# Patient Record
Sex: Female | Born: 1985 | Race: White | Hispanic: No | Marital: Single | Smoking: Current every day smoker
Health system: Southern US, Community
[De-identification: ages and names within clinical notes are randomized; demographics above are authoritative.]

## PROBLEM LIST (undated history)

## (undated) DIAGNOSIS — Z87442 Personal history of urinary calculi: Secondary | ICD-10-CM

## (undated) DIAGNOSIS — G44009 Cluster headache syndrome, unspecified, not intractable: Secondary | ICD-10-CM

## (undated) DIAGNOSIS — N200 Calculus of kidney: Secondary | ICD-10-CM

## (undated) HISTORY — PX: TUBAL LIGATION: SHX77

---

## 2016-10-02 HISTORY — PX: CYSTOSCOPY/URETEROSCOPY/HOLMIUM LASER/STENT PLACEMENT: SHX6546

## 2017-05-01 ENCOUNTER — Inpatient Hospital Stay: Payer: Medicaid Other | Admitting: Anesthesiology

## 2017-05-01 ENCOUNTER — Emergency Department: Payer: Medicaid Other

## 2017-05-01 ENCOUNTER — Encounter: Payer: Self-pay | Admitting: Emergency Medicine

## 2017-05-01 ENCOUNTER — Observation Stay
Admission: EM | Admit: 2017-05-01 | Discharge: 2017-05-02 | Disposition: A | Discharge status: Home / Self Care | Payer: Medicaid Other | Attending: Internal Medicine | Admitting: Internal Medicine

## 2017-05-01 ENCOUNTER — Encounter
Admission: EM | Disposition: A | Discharge status: Home / Self Care | Payer: Self-pay | Source: Home / Self Care | Attending: Emergency Medicine

## 2017-05-01 DIAGNOSIS — N132 Hydronephrosis with renal and ureteral calculous obstruction: Principal | ICD-10-CM | POA: Insufficient documentation

## 2017-05-01 DIAGNOSIS — F17200 Nicotine dependence, unspecified, uncomplicated: Secondary | ICD-10-CM | POA: Insufficient documentation

## 2017-05-01 DIAGNOSIS — N390 Urinary tract infection, site not specified: Secondary | ICD-10-CM | POA: Insufficient documentation

## 2017-05-01 DIAGNOSIS — L03115 Cellulitis of right lower limb: Secondary | ICD-10-CM | POA: Insufficient documentation

## 2017-05-01 DIAGNOSIS — N1330 Unspecified hydronephrosis: Secondary | ICD-10-CM

## 2017-05-01 DIAGNOSIS — Z880 Allergy status to penicillin: Secondary | ICD-10-CM | POA: Insufficient documentation

## 2017-05-01 DIAGNOSIS — N131 Hydronephrosis with ureteral stricture, not elsewhere classified: Secondary | ICD-10-CM

## 2017-05-01 DIAGNOSIS — R000 Tachycardia, unspecified: Secondary | ICD-10-CM | POA: Insufficient documentation

## 2017-05-01 DIAGNOSIS — W57XXXA Bitten or stung by nonvenomous insect and other nonvenomous arthropods, initial encounter: Secondary | ICD-10-CM | POA: Insufficient documentation

## 2017-05-01 DIAGNOSIS — Z87442 Personal history of urinary calculi: Secondary | ICD-10-CM | POA: Insufficient documentation

## 2017-05-01 DIAGNOSIS — R1032 Left lower quadrant pain: Secondary | ICD-10-CM

## 2017-05-01 DIAGNOSIS — N200 Calculus of kidney: Secondary | ICD-10-CM

## 2017-05-01 DIAGNOSIS — Z9851 Tubal ligation status: Secondary | ICD-10-CM | POA: Insufficient documentation

## 2017-05-01 HISTORY — PX: CYSTOSCOPY W/ RETROGRADES: SHX1426

## 2017-05-01 HISTORY — PX: CYSTOSCOPY WITH STENT PLACEMENT: SHX5790

## 2017-05-01 HISTORY — DX: Cluster headache syndrome, unspecified, not intractable: G44.009

## 2017-05-01 HISTORY — DX: Calculus of kidney: N20.0

## 2017-05-01 LAB — CBC WITH DIFFERENTIAL/PLATELET
Basophils Absolute: 0.1 10*3/uL (ref 0–0.1)
Basophils Relative: 1 %
Eosinophils Absolute: 0.1 10*3/uL (ref 0–0.7)
Eosinophils Relative: 1 %
HCT: 40.9 % (ref 35.0–47.0)
Hemoglobin: 14.4 g/dL (ref 12.0–16.0)
Lymphocytes Relative: 27 %
Lymphs Abs: 2.6 10*3/uL (ref 1.0–3.6)
MCH: 31.2 pg (ref 26.0–34.0)
MCHC: 35.1 g/dL (ref 32.0–36.0)
MCV: 88.8 fL (ref 80.0–100.0)
Monocytes Absolute: 0.7 10*3/uL (ref 0.2–0.9)
Monocytes Relative: 7 %
Neutro Abs: 6.5 10*3/uL (ref 1.4–6.5)
Neutrophils Relative %: 66 %
Platelets: 230 10*3/uL (ref 150–440)
RBC: 4.61 MIL/uL (ref 3.80–5.20)
RDW: 12.6 % (ref 11.5–14.5)
WBC: 9.9 10*3/uL (ref 3.6–11.0)

## 2017-05-01 LAB — COMPREHENSIVE METABOLIC PANEL
ALT: 20 U/L (ref 14–54)
AST: 21 U/L (ref 15–41)
Albumin: 4.1 g/dL (ref 3.5–5.0)
Alkaline Phosphatase: 72 U/L (ref 38–126)
Anion gap: 7 (ref 5–15)
BUN: 9 mg/dL (ref 6–20)
CO2: 26 mmol/L (ref 22–32)
Calcium: 9 mg/dL (ref 8.9–10.3)
Chloride: 101 mmol/L (ref 101–111)
Creatinine, Ser: 0.72 mg/dL (ref 0.44–1.00)
GFR calc Af Amer: >60 mL/min (ref >60)
GFR calc non Af Amer: >60 mL/min (ref >60)
Glucose, Bld: 98 mg/dL (ref 65–99)
Potassium: 3.5 mmol/L (ref 3.5–5.1)
Sodium: 134 mmol/L — ABNORMAL LOW (ref 135–145)
Total Bilirubin: 0.8 mg/dL (ref 0.3–1.2)
Total Protein: 7.3 g/dL (ref 6.5–8.1)

## 2017-05-01 LAB — BASIC METABOLIC PANEL
ANION GAP: 7 (ref 5–15)
BUN: 9 mg/dL (ref 6–20)
CALCIUM: 8.5 mg/dL — AB (ref 8.9–10.3)
CHLORIDE: 105 mmol/L (ref 101–111)
CO2: 25 mmol/L (ref 22–32)
Creatinine, Ser: 0.75 mg/dL (ref 0.44–1.00)
GFR calc non Af Amer: >60 mL/min (ref >60)
GLUCOSE: 111 mg/dL — AB (ref 65–99)
Potassium: 3.6 mmol/L (ref 3.5–5.1)
Sodium: 137 mmol/L (ref 135–145)

## 2017-05-01 LAB — URINALYSIS, ROUTINE W REFLEX MICROSCOPIC
Bilirubin Urine: NEGATIVE
GLUCOSE, UA: NEGATIVE mg/dL
Ketones, ur: NEGATIVE mg/dL
NITRITE: POSITIVE — AB
Protein, ur: NEGATIVE mg/dL
SPECIFIC GRAVITY, URINE: 1.008 (ref 1.005–1.030)
pH: 6 (ref 5.0–8.0)

## 2017-05-01 LAB — CBC
HCT: 38 % (ref 35.0–47.0)
HEMOGLOBIN: 13 g/dL (ref 12.0–16.0)
MCH: 30.9 pg (ref 26.0–34.0)
MCHC: 34.3 g/dL (ref 32.0–36.0)
MCV: 90.2 fL (ref 80.0–100.0)
Platelets: 203 10*3/uL (ref 150–440)
RBC: 4.21 MIL/uL (ref 3.80–5.20)
RDW: 12.6 % (ref 11.5–14.5)
WBC: 12.1 10*3/uL — ABNORMAL HIGH (ref 3.6–11.0)

## 2017-05-01 LAB — POCT PREGNANCY, URINE: Preg Test, Ur: NEGATIVE

## 2017-05-01 LAB — LACTIC ACID, PLASMA
Lactic Acid, Venous: 1 mmol/L (ref 0.5–1.9)
Lactic Acid, Venous: 1.1 mmol/L (ref 0.5–1.9)

## 2017-05-01 SURGERY — CYSTOSCOPY, WITH STENT INSERTION
Anesthesia: General | Site: Ureter | Laterality: Left | Wound class: Clean Contaminated

## 2017-05-01 MED ORDER — CEFTRIAXONE SODIUM-DEXTROSE 1-3.74 GM-% IV SOLR
1.0000 g | INTRAVENOUS | Status: DC
  Filled 2017-05-01 (×4): qty 50

## 2017-05-01 MED ORDER — IPRATROPIUM-ALBUTEROL 0.5-2.5 (3) MG/3ML IN SOLN
3.0000 mL | Freq: Once | RESPIRATORY_TRACT | Status: AC | PRN
  Administered 2017-05-01: 3 mL via RESPIRATORY_TRACT

## 2017-05-01 MED ORDER — MIDAZOLAM HCL 2 MG/2ML IJ SOLN
INTRAMUSCULAR | Status: DC | PRN
  Administered 2017-05-01: 2 mg via INTRAVENOUS

## 2017-05-01 MED ORDER — IPRATROPIUM-ALBUTEROL 0.5-2.5 (3) MG/3ML IN SOLN
RESPIRATORY_TRACT | Status: AC
  Filled 2017-05-01: qty 3

## 2017-05-01 MED ORDER — OXYCODONE HCL 5 MG/5ML PO SOLN: 5.0000 mg | Freq: Once | ORAL | Status: DC | PRN

## 2017-05-01 MED ORDER — ONDANSETRON HCL 4 MG/2ML IJ SOLN: 4.0000 mg | Freq: Four times a day (QID) | INTRAMUSCULAR | Status: DC | PRN

## 2017-05-01 MED ORDER — LACTATED RINGERS IV SOLN
INTRAVENOUS | Status: DC | PRN
  Administered 2017-05-01: 21:00:00 via INTRAVENOUS

## 2017-05-01 MED ORDER — PROPOFOL 10 MG/ML IV BOLUS
INTRAVENOUS | Status: DC | PRN
  Administered 2017-05-01: 180 mg via INTRAVENOUS
  Administered 2017-05-01: 100 mg via INTRAVENOUS

## 2017-05-01 MED ORDER — ONDANSETRON HCL 4 MG PO TABS: 4.0000 mg | ORAL_TABLET | Freq: Four times a day (QID) | ORAL | Status: DC | PRN

## 2017-05-01 MED ORDER — FENTANYL CITRATE (PF) 100 MCG/2ML IJ SOLN
INTRAMUSCULAR | Status: AC
  Filled 2017-05-01: qty 2

## 2017-05-01 MED ORDER — ONDANSETRON HCL 4 MG/2ML IJ SOLN
INTRAMUSCULAR | Status: DC | PRN
  Administered 2017-05-01: 4 mg via INTRAVENOUS

## 2017-05-01 MED ORDER — FENTANYL CITRATE (PF) 100 MCG/2ML IJ SOLN
INTRAMUSCULAR | Status: DC | PRN
  Administered 2017-05-01 (×2): 50 ug via INTRAVENOUS

## 2017-05-01 MED ORDER — ACETAMINOPHEN 325 MG PO TABS: 650.0000 mg | ORAL_TABLET | Freq: Four times a day (QID) | ORAL | Status: DC | PRN

## 2017-05-01 MED ORDER — PROPOFOL 10 MG/ML IV BOLUS
INTRAVENOUS | Status: AC
  Filled 2017-05-01: qty 20

## 2017-05-01 MED ORDER — LIDOCAINE HCL (CARDIAC) 20 MG/ML IV SOLN
INTRAVENOUS | Status: DC | PRN
  Administered 2017-05-01: 100 mg via INTRAVENOUS

## 2017-05-01 MED ORDER — OXYCODONE HCL 5 MG PO TABS: 5.0000 mg | ORAL_TABLET | Freq: Once | ORAL | Status: DC | PRN

## 2017-05-01 MED ORDER — CIPROFLOXACIN IN D5W 400 MG/200ML IV SOLN
400.0000 mg | INTRAVENOUS | Status: AC
  Administered 2017-05-01: 400 mg via INTRAVENOUS

## 2017-05-01 MED ORDER — HYDROCODONE-ACETAMINOPHEN 5-325 MG PO TABS: 1.0000 | ORAL_TABLET | ORAL | Status: DC | PRN

## 2017-05-01 MED ORDER — SUGAMMADEX SODIUM 200 MG/2ML IV SOLN
INTRAVENOUS | Status: DC | PRN
  Administered 2017-05-01: 180 mg via INTRAVENOUS

## 2017-05-01 MED ORDER — DEXAMETHASONE SODIUM PHOSPHATE 10 MG/ML IJ SOLN
INTRAMUSCULAR | Status: DC | PRN
  Administered 2017-05-01: 10 mg via INTRAVENOUS

## 2017-05-01 MED ORDER — MIDAZOLAM HCL 2 MG/2ML IJ SOLN
INTRAMUSCULAR | Status: AC
  Filled 2017-05-01: qty 2

## 2017-05-01 MED ORDER — POTASSIUM CHLORIDE IN NACL 20-0.9 MEQ/L-% IV SOLN
INTRAVENOUS | Status: DC
  Administered 2017-05-01: via INTRAVENOUS
  Filled 2017-05-01 (×5): qty 1000

## 2017-05-01 MED ORDER — KETOROLAC TROMETHAMINE 30 MG/ML IJ SOLN: 30.0000 mg | Freq: Four times a day (QID) | INTRAMUSCULAR | Status: DC | PRN

## 2017-05-01 MED ORDER — SODIUM CHLORIDE 0.9 % IV BOLUS (SEPSIS)
1000.0000 mL | Freq: Once | INTRAVENOUS | Status: AC
  Administered 2017-05-01: 1000 mL via INTRAVENOUS

## 2017-05-01 MED ORDER — IBUPROFEN 400 MG PO TABS
400.0000 mg | ORAL_TABLET | Freq: Four times a day (QID) | ORAL | Status: DC | PRN
  Administered 2017-05-01 – 2017-05-02 (×2): 400 mg via ORAL
  Filled 2017-05-01 (×2): qty 1

## 2017-05-01 MED ORDER — POLYETHYLENE GLYCOL 3350 17 G PO PACK: 17.0000 g | PACK | Freq: Every day | ORAL | Status: DC | PRN

## 2017-05-01 MED ORDER — ACETAMINOPHEN 650 MG RE SUPP: 650.0000 mg | Freq: Four times a day (QID) | RECTAL | Status: DC | PRN

## 2017-05-01 MED ORDER — CIPROFLOXACIN IN D5W 400 MG/200ML IV SOLN
INTRAVENOUS | Status: AC
  Administered 2017-05-01: 400 mg via INTRAVENOUS
  Filled 2017-05-01: qty 200

## 2017-05-01 MED ORDER — ROCURONIUM BROMIDE 100 MG/10ML IV SOLN
INTRAVENOUS | Status: DC | PRN
  Administered 2017-05-01: 5 mg via INTRAVENOUS
  Administered 2017-05-01: 10 mg via INTRAVENOUS

## 2017-05-01 MED ORDER — SODIUM CHLORIDE 0.9 % IV SOLN
INTRAVENOUS | Status: DC | PRN
  Administered 2017-05-01: 5 mL

## 2017-05-01 MED ORDER — FENTANYL CITRATE (PF) 100 MCG/2ML IJ SOLN
25.0000 ug | INTRAMUSCULAR | Status: DC | PRN
  Administered 2017-05-01 (×2): 25 ug via INTRAVENOUS

## 2017-05-01 MED ORDER — CEFTRIAXONE SODIUM IN DEXTROSE 20 MG/ML IV SOLN
1.0000 g | INTRAVENOUS | Status: DC
  Administered 2017-05-01: 1 g via INTRAVENOUS

## 2017-05-01 SURGICAL SUPPLY — 19 items
BAG DRAIN CYSTO-URO LG1000N (MISCELLANEOUS) ×2 IMPLANT
BOSTON SCIENTIFIC PERCUFLEX PLUS ×2 IMPLANT
CANISTER SUCT LVC 12 LTR MEDI- (MISCELLANEOUS) ×2 IMPLANT
CATH URETL 5X70 OPEN END (CATHETERS) ×2 IMPLANT
DRAPE XRAY CASSETTE 23X24 (DRAPES) ×2 IMPLANT
GOWN STRL REUS W/ TWL LRG LVL4 (GOWN DISPOSABLE) ×1 IMPLANT
GOWN STRL REUS W/ TWL XL LVL3 (GOWN DISPOSABLE) ×1 IMPLANT
GOWN STRL REUS W/TWL LRG LVL4 (GOWN DISPOSABLE) ×1
GOWN STRL REUS W/TWL XL LVL3 (GOWN DISPOSABLE) ×1
KIT RM TURNOVER CYSTO AR (KITS) ×2 IMPLANT
NS IRRIG 500ML POUR BTL (IV SOLUTION) ×2 IMPLANT
PACK CYSTO AR (MISCELLANEOUS) ×2 IMPLANT
SENSORWIRE 0.038 NOT ANGLED (WIRE) ×4
SET CYSTO W/LG BORE CLAMP LF (SET/KITS/TRAYS/PACK) ×2 IMPLANT
SOL .9 NS 3000ML IRR  AL (IV SOLUTION) ×1
SOL .9 NS 3000ML IRR UROMATIC (IV SOLUTION) ×1 IMPLANT
STENT URETL SOFT 6X24 (Stent) ×2 IMPLANT
WATER STERILE IRR 1000ML POUR (IV SOLUTION) ×2 IMPLANT
WIRE SENSOR 0.038 NOT ANGLED (WIRE) ×2 IMPLANT

## 2017-05-01 NOTE — Brief Op Note (Signed)
05/01/2017  9:24 PM  PATIENT:  Leeroy Bockheresa Gayle Trueba  31 y.o. female  PRE-OPERATIVE DIAGNOSIS:  hydronephritis,UTI and pain  POST-OPERATIVE DIAGNOSIS:  hydronephritis,UTI and pain  PROCEDURE:  Procedure(s): CYSTOSCOPY WITH STENT PLACEMENT (Left) CYSTOSCOPY WITH RETROGRADE PYELOGRAM (Left)  SURGEON:  Surgeon(s) and Role:    Bjorn Pippin* Sohil Timko, MD - Primary  PHYSICIAN ASSISTANT:   ASSISTANTS: none   ANESTHESIA:   general  EBL:  No intake/output data recorded.  BLOOD ADMINISTERED:none  DRAINS: 6 x 24 left JJ stent   LOCAL MEDICATIONS USED:  NONE  SPECIMEN:  No Specimen  DISPOSITION OF SPECIMEN:  N/A  COUNTS:  YES  TOURNIQUET:  * No tourniquets in log *  DICTATION: .Other Dictation: Dictation Number [id number]  PLAN OF CARE: Admit for overnight observation  PATIENT DISPOSITION:  PACU - hemodynamically stable.   Delay start of Pharmacological VTE agent (>24hrs) due to surgical blood loss or risk of bleeding: not applicable

## 2017-05-01 NOTE — ED Provider Notes (Signed)
[facility] Emergency Department Provider Note  ____________________________________________  Time seen: Approximately 3:47 PM  I have reviewed the triage vital signs and the nursing notes.   HISTORY  Chief Complaint Urinary Tract Infection and Insect Bite    HPI Kathleen Hutchinson is a 31 y.o. female with a history of nephrolithiasis presents to the emergency department with left flank pain, nocturia and general increased urinary frequency for the past 3 days. No dysuria or hematuria. She has recently moved to [place] from [place]. Patient states that she had to have stents placed for hydroureteronephrosis in October of 2017. Stents were removed after two weeks. Patient has been afebrile. No chills. Patient denies associated nausea, vomiting or abdominal pain. Patient also presents to the emergency department with a 3 cm region of circumferential cellulitis of the right thigh. Patient states "I think I was bitten by a spider". Patient denies muscle spasms, chest pain, weakness and radiculopathy. No alleviating measures have been attempted.    Past Medical History:  Diagnosis Date  . Headaches, cluster     There are no active problems to display for this patient.   Past Surgical History:  Procedure Laterality Date  . TUBAL LIGATION      Prior to Admission medications   Medication Sig Start Date End Date Taking? Authorizing Provider  ibuprofen (ADVIL,MOTRIN) 200 MG tablet Take 200 mg by mouth every 6 (six) hours as needed.   Yes [provider]  Multiple Vitamin (MULTIVITAMIN) tablet Take 1 tablet by mouth daily.   Yes [provider]    Allergies Augmentin [amoxicillin-pot clavulanate]  No family history on file.  Social History Social History  Substance Use Topics  . Smoking status: Current Every Day Smoker  . Smokeless tobacco: Never Used  . Alcohol use No     Review of Systems  Constitutional: No  fever/chills Eyes: No visual changes. No discharge ENT: No upper respiratory complaints. Cardiovascular: no chest pain. Respiratory: no cough. No SOB. Gastrointestinal: No abdominal pain.  No nausea, no vomiting.  No diarrhea.  No constipation. Genitourinary: Patient has nocturia and increased urinary frequency along with left flank pain. Musculoskeletal: Negative for musculoskeletal pain. Skin: Negative for rash, abrasions, lacerations, ecchymosis. Neurological: Negative for headaches, focal weakness or numbness.  ____________________________________________   PHYSICAL EXAM:  VITAL SIGNS: ED Triage Vitals  Enc Vitals Group     BP 05/01/17 1523 (!) 148/77     Pulse Rate 05/01/17 1523 (!) 104     Resp 05/01/17 1523 20     Temp 05/01/17 1523 98 F (36.7 C)     Temp Source 05/01/17 1523 Oral     SpO2 05/01/17 1523 100 %     Weight 05/01/17 1524 200 lb (90.7 kg)     Height 05/01/17 1524 5\' 8"  (1.727 m)     Head Circumference --      Peak Flow --      Pain Score 05/01/17 1523 5     Pain Loc --      Pain Edu? --      Excl. in GC? --      Constitutional: Alert and oriented. Well appearing and in no acute distress. Eyes: Conjunctivae are normal. PERRL. EOMI. Head: Atraumatic. Cardiovascular: Normal rate, regular rhythm. Normal S1 and S2.  Good peripheral circulation. Respiratory: Normal respiratory effort without tachypnea or retractions. Lungs CTAB. Good air entry to the bases with no decreased or absent breath sounds. Gastrointestinal: Bowel sounds 4 quadrants. Soft and  nontender to palpation. No guarding or rigidity. No palpable masses. No distention. Patient has left-sided CVA tenderness. Musculoskeletal: Full range of motion to all extremities. No gross deformities appreciated. Neurologic:  Normal speech and language. No gross focal neurologic deficits are appreciated.  Skin: Patient has 3 cm of circumferential cellulitis of the right thigh. Psychiatric: Mood and affect  are normal. Speech and behavior are normal. Patient exhibits appropriate insight and judgement.   ____________________________________________   LABS (all labs ordered are listed, but only abnormal results are displayed)  Labs Reviewed  URINALYSIS, ROUTINE W REFLEX MICROSCOPIC - Abnormal; Notable for the following:       Result Value   Color, Urine YELLOW (*)    APPearance CLOUDY (*)    Hgb urine dipstick SMALL (*)    Nitrite POSITIVE (*)    Leukocytes, UA LARGE (*)    Bacteria, UA MANY (*)    Squamous Epithelial / LPF 6-30 (*)    All other components within normal limits  CBC WITH DIFFERENTIAL/PLATELET  COMPREHENSIVE METABOLIC PANEL  LACTIC ACID, PLASMA  LACTIC ACID, PLASMA  POC URINE PREG, ED  POCT PREGNANCY, URINE   ____________________________________________  EKG   ____________________________________________  RADIOLOGY Geraldo PitterI, Fawne Hughley M Alexandar Weisenberger, personally viewed and evaluated these images  as part of my medical decision making, as well as reviewing the written report by the radiologist.    Ct Renal Stone Study  Result Date: 05/01/2017 CLINICAL DATA:  Urinary frequency and low back pain. EXAM: CT ABDOMEN AND PELVIS WITHOUT CONTRAST TECHNIQUE: Multidetector CT imaging of the abdomen and pelvis was performed following the standard protocol without IV contrast. COMPARISON:  None. FINDINGS: Lower chest: No pulmonary nodules or pleural effusion. No visible pericardial effusion. Hepatobiliary: Normal noncontrast appearance of the liver. No visible biliary dilatation. Normal gallbladder. Pancreas: Normal noncontrast appearance of the pancreas. No peripancreatic fluid collection. Spleen: Normal. Adrenals/Urinary Tract: --Adrenal glands: Normal. --Right kidney/ureter: No hydronephrosis or perinephric stranding. No nephrolithiasis. No obstructing ureteral stones. --Left kidney/ureter: There is severe left hydroureteronephrosis. There is no obstructing stone within the ureter or at the  ureterovesical junction. There are multiple nonobstructing left renal stones. A stone at the upper pole measures 8 mm. Stone the lower pole measures 5 mm. A third stone measures 5 mm. There is no perinephric stranding. --Urinary bladder: Partially decompressed. No focal abnormality visualized. Stomach/Bowel: There is no hiatal hernia. The stomach and duodenum are normal. There is no dilated small bowel or enteric inflammation. There is no colonic abnormality. The appendix is normal. Vascular/Lymphatic: No abdominal aortic aneurysm or atherosclerotic calcification. No abdominal or pelvic lymphadenopathy. Reproductive: Normal uterus and ovaries. Musculoskeletal. No focal osseous lesion. Normal visualized extraperitoneal and extrathoracic soft tissues. IMPRESSION: 1. Severe left hydroureteronephrosis without a visible source of obstruction. No ureteral or ureterovesical junction stones. Additionally, the lack of perinephric stranding may indicate that this is a chronic process. If there is prior imaging of the GU tract from another institution, comparison would be helpful. Urological consultation and/or cystoscopy could be considered to evaluate for potential lesion of the ureteral orifice, if clinically warranted. 2. Multiple nonobstructing left renal stones measuring up to 8 mm. Electronically Signed   By: Deatra RobinsonKevin  Herman M.D.   On: 05/01/2017 16:34    ____________________________________________    PROCEDURES  Procedure(s) performed:    Procedures    Medications - No data to display   ____________________________________________   INITIAL IMPRESSION / ASSESSMENT AND PLAN / ED COURSE  Pertinent labs & imaging results that were available  during my care of the patient were reviewed by me and considered in my medical decision making (see chart for details).  Review of the [place] CSRS was performed in accordance of the [facility] prior to dispensing any controlled drugs.     Assessment and  Plan: Hydroureteronephrosis Patient presents to the emergency department with left flank pain and increased urinary frequency. Urinalysis was concerning with bacteria, nitrites and leukocytes and a small amount of blood. CT renal stone study revealed severe hydroureteronephrosis. Dr. Wilson Singer, the urologist on-call, was consulted. Patient's case was discussed with Dr. Wilson Singer who advised admission to the hospital for stent application and antibiotics. Dr. Wardell Heath (prime doc) accepted patient for admission and further management. All patient questions were answered.     ____________________________________________  FINAL CLINICAL IMPRESSION(S) / ED DIAGNOSES  Final diagnoses:  Hydroureteronephrosis      NEW MEDICATIONS STARTED DURING THIS VISIT:  New Prescriptions   No medications on file        This chart was dictated using voice recognition software/Dragon. Despite best efforts to proofread, errors can occur which can change the meaning. Any change was purely unintentional.    Orvil Feil, PA-C 05/01/17 1736    Pia Mau Briar, PA-C 05/01/17 1746    Orvil Feil, PA-C 05/01/17 Carlis Stable    Merrily Brittle, MD 05/01/17 Barry Brunner

## 2017-05-01 NOTE — ED Notes (Signed)
First Nurse Note   Presents with possible UTI urinary freq and pain  Also thinks she may have been stung by an insect to groin area

## 2017-05-01 NOTE — Consult Note (Signed)
Subjective: CC: Left flank pain.   I was asked to see Kathleen Hutchinson in consultation by Marlin Canary PA for left hydronephrosis and a UTI.  Kathleen Hutchinson has a history of stones and had left ureteroscopy with laser and stent in 10/17 in [place].  She had the onset 2 weeks ago of left flank and abdominal pain that began intermittently severe and was worse with lying down.  Her pain worsened today so she came to the ED.  She had a CT that showed marked left hydro with dilation of the ureter to the UVJ but without apparent stone.   She had stones with the left kidney with the largest about 8mm.   She has chills but no fever and her UA looks infected.  She reports chronic asymptomatic infections.    She has no voiding complaints or gross hematuria.   ROS:  Review of Systems  Constitutional: Positive for chills. Negative for fever.  Respiratory: Negative for shortness of breath.   Cardiovascular: Negative for chest pain.  Gastrointestinal: Negative for nausea and vomiting.  Genitourinary: Positive for flank pain. Negative for hematuria.  Neurological: Positive for headaches.  All other systems reviewed and are negative.   Allergies  Allergen Reactions  . Augmentin [Amoxicillin-Pot Clavulanate] Rash    Past Medical History:  Diagnosis Date  . Headaches, cluster   . Nephrolithiasis     Past Surgical History:  Procedure Laterality Date  . CESAREAN SECTION    . CYSTOSCOPY/URETEROSCOPY/HOLMIUM LASER/STENT PLACEMENT Left 10/02/2016  . TUBAL LIGATION    . VAGINAL DELIVERY     x 4    Social History   Social History  . Marital status: Single    Spouse name: N/A  . Number of children: N/A  . Years of education: N/A   Occupational History  . clerical    Social History Main Topics  . Smoking status: Current Every Day Smoker  . Smokeless tobacco: Never Used  . Alcohol use No  . Drug use: No  . Sexual activity: Not on file   Other Topics Concern  . Not on file   Social History  Narrative  . No narrative on file    Family History  Problem Relation Age of Onset  . Cholecystitis Mother   . Nephrolithiasis Neg Hx   . Migraines Neg Hx     Anti-infectives: Anti-infectives    Start     Dose/Rate Route Frequency Ordered Stop   05/01/17 1830  cefTRIAXone (ROCEPHIN) 1 g in dextrose 5 % 50 mL IVPB - Premix     1 g 100 mL/hr over 30 Minutes Intravenous Every 24 hours 05/01/17 1822        Current Facility-Administered Medications  Medication Dose Route Frequency Provider Last Rate Last Dose  . 0.9 % NaCl with KCl 20 mEq/ L  infusion   Intravenous Continuous Sudini, Srikar, MD      . acetaminophen (TYLENOL) tablet 650 mg  650 mg Oral Q6H PRN Milagros Loll, MD       Or  . acetaminophen (TYLENOL) suppository 650 mg  650 mg Rectal Q6H PRN Sudini, Srikar, MD      . cefTRIAXone (ROCEPHIN) 1 g in dextrose 5 % 50 mL IVPB - Premix  1 g Intravenous Q24H Sudini, Srikar, MD      . ibuprofen (ADVIL,MOTRIN) tablet 400 mg  400 mg Oral Q6H PRN Sudini, Srikar, MD      . ketorolac (TORADOL) 30 MG/ML injection 30 mg  30 mg Intravenous  Q6H PRN Milagros LollSudini, Srikar, MD      . ondansetron [facility](ZOFRAN) tablet 4 mg  4 mg Oral Q6H PRN Milagros LollSudini, Srikar, MD       Or  . ondansetron (ZOFRAN) injection 4 mg  4 mg Intravenous Q6H PRN Sudini, Srikar, MD      . polyethylene glycol (MIRALAX / GLYCOLAX) packet 17 g  17 g Oral Daily PRN Sudini, Srikar, MD      . sodium chloride 0.9 % bolus 1,000 mL  1,000 mL Intravenous Once Milagros LollSudini, Srikar, MD 1,000 mL/hr at 05/01/17 1901 1,000 mL at 05/01/17 1901   Current Outpatient Prescriptions  Medication Sig Dispense Refill  . ibuprofen (ADVIL,MOTRIN) 200 MG tablet Take 200 mg by mouth every 6 (six) hours as needed.    . Multiple Vitamin (MULTIVITAMIN) tablet Take 1 tablet by mouth daily.       Objective: Vital signs in last 24 hours: Temp:  [98 F (36.7 C)] 98 F (36.7 C) (05/25 1523) Pulse Rate:  [71-104] 71 (05/25 1827) Resp:  [18-20] 18 (05/25 1827) BP:  (131-148)/(77-78) 131/78 (05/25 1827) SpO2:  [97 %-100 %] 97 % (05/25 1827) Weight:  [90.7 kg (200 lb)] 90.7 kg (200 lb) (05/25 1524)  Intake/Output from previous day: No intake/output data recorded. Intake/Output this shift: No intake/output data recorded.   Physical Exam  Constitutional: She is oriented to person, place, and time and well-developed, well-nourished, and in no distress.  HENT:  Head: Normocephalic and atraumatic.  Neck: Normal range of motion. Neck supple. No thyromegaly present.  Cardiovascular: Normal rate, regular rhythm and normal heart sounds.   Pulmonary/Chest: Effort normal and breath sounds normal. No respiratory distress.  Abdominal: Soft. There is tenderness (LUQ and LCVAT).  Musculoskeletal: Normal range of motion. She exhibits deformity. She exhibits no edema.  Lymphadenopathy:    She has no cervical adenopathy.       Right: No inguinal and no supraclavicular adenopathy present.       Left: No inguinal and no supraclavicular adenopathy present.  Neurological: She is alert and oriented to person, place, and time.  Skin: Skin is warm and dry.  Psychiatric: Mood and affect normal.  Vitals reviewed.   Lab Results:   Recent Labs  05/01/17 1710  WBC 9.9  HGB 14.4  HCT 40.9  PLT 230   BMET  Recent Labs  05/01/17 1710  NA 134*  K 3.5  CL 101  CO2 26  GLUCOSE 98  BUN 9  CREATININE 0.72  CALCIUM 9.0   PT/INR No results for input(s): LABPROT, INR in the last 72 hours. ABG No results for input(s): PHART, HCO3 in the last 72 hours.  Invalid input(s): PCO2, PO2  Studies/Results: Ct Renal Stone Study  Result Date: 05/01/2017 CLINICAL DATA:  Urinary frequency and low back pain. EXAM: CT ABDOMEN AND PELVIS WITHOUT CONTRAST TECHNIQUE: Multidetector CT imaging of the abdomen and pelvis was performed following the standard protocol without IV contrast. COMPARISON:  None. FINDINGS: Lower chest: No pulmonary nodules or pleural effusion. No visible  pericardial effusion. Hepatobiliary: Normal noncontrast appearance of the liver. No visible biliary dilatation. Normal gallbladder. Pancreas: Normal noncontrast appearance of the pancreas. No peripancreatic fluid collection. Spleen: Normal. Adrenals/Urinary Tract: --Adrenal glands: Normal. --Right kidney/ureter: No hydronephrosis or perinephric stranding. No nephrolithiasis. No obstructing ureteral stones. --Left kidney/ureter: There is severe left hydroureteronephrosis. There is no obstructing stone within the ureter or at the ureterovesical junction. There are multiple nonobstructing left renal stones. A stone at the upper pole measures  8 mm. Stone the lower pole measures 5 mm. A third stone measures 5 mm. There is no perinephric stranding. --Urinary bladder: Partially decompressed. No focal abnormality visualized. Stomach/Bowel: There is no hiatal hernia. The stomach and duodenum are normal. There is no dilated small bowel or enteric inflammation. There is no colonic abnormality. The appendix is normal. Vascular/Lymphatic: No abdominal aortic aneurysm or atherosclerotic calcification. No abdominal or pelvic lymphadenopathy. Reproductive: Normal uterus and ovaries. Musculoskeletal. No focal osseous lesion. Normal visualized extraperitoneal and extrathoracic soft tissues. IMPRESSION: 1. Severe left hydroureteronephrosis without a visible source of obstruction. No ureteral or ureterovesical junction stones. Additionally, the lack of perinephric stranding may indicate that this is a chronic process. If there is prior imaging of the GU tract from another institution, comparison would be helpful. Urological consultation and/or cystoscopy could be considered to evaluate for potential lesion of the ureteral orifice, if clinically warranted. 2. Multiple nonobstructing left renal stones measuring up to 8 mm. Electronically Signed   By: Deatra Robinson M.D.   On: 05/01/2017 16:34   Her WBC is normal as are her chemistries  and lactic acid.   I have reviewed the CT films and report and discussed the case with Zigmund Gottron PA.  Assessment: 1. 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 2. UTI but she has no fever or leukocytosis. 3. Left renal stones.  Plan: I will initiate antibiotic coverage with Cipro pending a culture. I am going to take her to the OR tonight for cystoscopy with left retrograde pyelogram and attempted stent insertion.  I have reviewed the risks of bleeding, infection, inability to canulate the ureter potentially requiring Percutaneous nephrostomy, possible need for subsequent ureteral reimplant, anesthetic complications or thrombotic events.    CC: Dr. Jarvis Morgan and Zigmund Gottron PA.         Coleta Grosshans J 05/01/2017 [phone]

## 2017-05-01 NOTE — Anesthesia Preprocedure Evaluation (Signed)
Anesthesia Evaluation  Patient identified by MRN, date of birth, ID band Patient awake    Reviewed: Allergy & Precautions, H&P , NPO status , Patient's Chart, lab work & pertinent test results  History of Anesthesia Complications Negative for: history of anesthetic complications  Airway Mallampati: III  TM Distance: >3 FB Neck ROM: full    Dental  (+) Poor Dentition, Chipped, Caps   Pulmonary neg shortness of breath, Current Smoker,    Pulmonary exam normal breath sounds clear to auscultation       Cardiovascular Exercise Tolerance: Good (-) angina(-) Past MI and (-) DOE negative cardio ROS Normal cardiovascular exam Rhythm:regular Rate:Normal     Neuro/Psych  Headaches, negative psych ROS   GI/Hepatic negative GI ROS, Neg liver ROS, neg GERD  ,  Endo/Other  negative endocrine ROS  Renal/GU Renal disease     Musculoskeletal   Abdominal   Peds  Hematology negative hematology ROS (+)   Anesthesia Other Findings Past Medical History: No date: Headaches, cluster No date: Nephrolithiasis  Past Surgical History: No date: CESAREAN SECTION 10/02/2016: CYSTOSCOPY/URETEROSCOPY/HOLMIUM LASER/STENT PL* Left No date: TUBAL LIGATION No date: VAGINAL DELIVERY     Comment: x 4  BMI    Body Mass Index:  30.41 kg/m      Reproductive/Obstetrics negative OB ROS                             Anesthesia Physical Anesthesia Plan  ASA: III  Anesthesia Plan: General ETT, Rapid Sequence and Cricoid Pressure   Post-op Pain Management:    Induction: Intravenous  Airway Management Planned: Oral ETT  Additional Equipment:   Intra-op Plan:   Post-operative Plan: Extubation in OR  Informed Consent: I have reviewed the patients History and Physical, chart, labs and discussed the procedure including the risks, benefits and alternatives for the proposed anesthesia with the patient or authorized  representative who has indicated his/her understanding and acceptance.   Dental Advisory Given  Plan Discussed with: Anesthesiologist, CRNA and Surgeon  Anesthesia Plan Comments: (Patient consented for risks of anesthesia including but not limited to:  - adverse reactions to medications - damage to teeth, lips or other oral mucosa - sore throat or hoarseness - Damage to heart, brain, lungs or loss of life  Patient voiced understanding.)        Anesthesia Quick Evaluation

## 2017-05-01 NOTE — Anesthesia Post-op Follow-up Note (Cosign Needed)
Anesthesia QCDR form completed.        

## 2017-05-01 NOTE — Transfer of Care (Signed)
Immediate Anesthesia Transfer of Care Note  Patient: Kathleen Hutchinson  Procedure(s) Performed: Procedure(s): CYSTOSCOPY WITH STENT PLACEMENT (Left) CYSTOSCOPY WITH RETROGRADE PYELOGRAM (Left)  Patient Location: PACU  Anesthesia Type:General  Level of Consciousness: awake  Airway & Oxygen Therapy: Patient connected to face mask oxygen  Post-op Assessment: Post -op Vital signs reviewed and stable  Post vital signs: stable  Last Vitals:  Vitals:   05/01/17 1827 05/01/17 2133  BP: 131/78 114/63  Pulse: 71 (!) 114  Resp: 18 19  Temp:  36.6 C    Last Pain:  Vitals:   05/01/17 2133  TempSrc: Temporal  PainSc:          Complications: No apparent anesthesia complications

## 2017-05-01 NOTE — Anesthesia Procedure Notes (Signed)
Procedure Name: Intubation Date/Time: 05/01/2017 9:10 PM Performed by: Irving BurtonBACHICH, Jaydalynn Olivero Pre-anesthesia Checklist: Patient identified, Emergency Drugs available, Suction available and Patient being monitored Patient Re-evaluated:Patient Re-evaluated prior to inductionOxygen Delivery Method: Circle system utilized Preoxygenation: Pre-oxygenation with 100% oxygen Intubation Type: IV induction and Rapid sequence Laryngoscope Size: Miller and 2 Grade View: Grade II Tube type: Oral Tube size: 7.0 mm Number of attempts: 1 Airway Equipment and Method: Bougie stylet Placement Confirmation: positive ETCO2 and breath sounds checked- equal and bilateral Secured at: 21 cm Tube secured with: Tape Dental Injury: Teeth and Oropharynx as per pre-operative assessment

## 2017-05-01 NOTE — OR Nursing (Signed)
D/t npo status, surgery is postponed until 9pm per Dr. Randa NgoPiscitello. Dr. Annabell HowellsWrenn notifed

## 2017-05-01 NOTE — ED Triage Notes (Signed)
Having urinary freq and pain with some lower back pain  alaso thinks she may have been stung by something to groin area

## 2017-05-01 NOTE — H&P (Signed)
[facility] [place] [facility]   PATIENT NAME: Kathleen Hutchinson    MR#:  [id number]  DATE OF BIRTH:  Oct 09, 1986  DATE OF ADMISSION:  05/01/2017  PRIMARY CARE PHYSICIAN: Patient, No Pcp Per   REQUESTING/REFERRING PHYSICIAN: Dr. Cyril Loosen  CHIEF COMPLAINT:   Chief Complaint  Patient presents with  . Urinary Tract Infection  . Insect Bite    HISTORY OF PRESENT ILLNESS:  Kathleen Hutchinson  is a 31 y.o. female with a known history of Headaches, nephrolithiasis with prior left ureteral stent in October 2017 presents to the hospital complaining of left flank back pain and dysuria. Patient feels her symptoms are similar to the last time she had stones. Here patient has been found to have UTI. CT scan shows severe left hydroureteronephrosis but no obstructing stones. Afebrile. White count is 9.9. But tachycardic at 104. Lactic acid pending. Urology has been consulted and patient will be going to the OR later today. Presently nothing by mouth.  PAST MEDICAL HISTORY:   Past Medical History:  Diagnosis Date  . Headaches, cluster   . Nephrolithiasis     PAST SURGICAL HISTORY:   Past Surgical History:  Procedure Laterality Date  . TUBAL LIGATION      SOCIAL HISTORY:   Social History  Substance Use Topics  . Smoking status: Current Every Day Smoker  . Smokeless tobacco: Never Used  . Alcohol use No    FAMILY HISTORY:   Family History  Problem Relation Age of Onset  . Nephrolithiasis Neg Hx   . Migraines Neg Hx     DRUG ALLERGIES:   Allergies  Allergen Reactions  . Augmentin [Amoxicillin-Pot Clavulanate] Rash    REVIEW OF SYSTEMS:   Review of Systems  Constitutional: Positive for malaise/fatigue. Negative for chills and fever.  HENT: Negative for sore throat.   Eyes: Negative for blurred vision, double vision and pain.  Respiratory: Negative for cough, hemoptysis, shortness of breath and wheezing.   Cardiovascular: Negative for chest pain,  palpitations, orthopnea and leg swelling.  Gastrointestinal: Positive for abdominal pain. Negative for constipation, diarrhea, heartburn, nausea and vomiting.  Genitourinary: Positive for dysuria. Negative for hematuria.  Musculoskeletal: Positive for back pain. Negative for joint pain.  Skin: Negative for rash.  Neurological: Positive for weakness. Negative for sensory change, speech change, focal weakness and headaches.  Endo/Heme/Allergies: Does not bruise/bleed easily.  Psychiatric/Behavioral: Negative for depression. The patient is not nervous/anxious.     MEDICATIONS AT HOME:   Prior to Admission medications   Medication Sig Start Date End Date Taking? Authorizing Provider  ibuprofen (ADVIL,MOTRIN) 200 MG tablet Take 200 mg by mouth every 6 (six) hours as needed.   Yes [provider]  Multiple Vitamin (MULTIVITAMIN) tablet Take 1 tablet by mouth daily.   Yes [provider]     VITAL SIGNS:  Blood pressure (!) 148/77, pulse (!) 104, temperature 98 F (36.7 C), temperature source Oral, resp. rate 20, height 5\' 8"  (1.727 m), weight 90.7 kg (200 lb), last menstrual period 04/13/2017, SpO2 100 %.  PHYSICAL EXAMINATION:  Physical Exam  GENERAL:  31 y.o.-year-old patient lying in the bed with no acute distress.  EYES: Pupils equal, round, reactive to light and accommodation. No scleral icterus. Extraocular muscles intact.  HEENT: Head atraumatic, normocephalic. Oropharynx and nasopharynx clear. No oropharyngeal erythema, moist oral mucosa  NECK:  Supple, no jugular venous distention. No thyroid enlargement, no tenderness.  LUNGS: Normal breath sounds bilaterally, no wheezing, rales, rhonchi.  No use of accessory muscles of respiration.  CARDIOVASCULAR: S1, S2 normal. No murmurs, rubs, or gallops.  ABDOMEN: Soft, nontender, nondistended. Bowel sounds present. No organomegaly or mass.  EXTREMITIES: No pedal edema, cyanosis, or clubbing. + 2 pedal & radial pulses b/l.    NEUROLOGIC: Cranial nerves II through XII are intact. No focal Motor or sensory deficits appreciated b/l PSYCHIATRIC: The patient is alert and oriented x 3. Good affect.  SKIN: No obvious rash, lesion, or ulcer.   LABORATORY PANEL:   CBC  Recent Labs Lab 05/01/17 1710  WBC 9.9  HGB 14.4  HCT 40.9  PLT 230   ------------------------------------------------------------------------------------------------------------------  Chemistries   Recent Labs Lab 05/01/17 1710  NA 134*  K 3.5  CL 101  CO2 26  GLUCOSE 98  BUN 9  CREATININE 0.72  CALCIUM 9.0  AST 21  ALT 20  ALKPHOS 72  BILITOT 0.8   ------------------------------------------------------------------------------------------------------------------  Cardiac Enzymes No results for input(s): TROPONINI in the last 168 hours. ------------------------------------------------------------------------------------------------------------------  RADIOLOGY:  Ct Renal Stone Study  Result Date: 05/01/2017 CLINICAL DATA:  Urinary frequency and low back pain. EXAM: CT ABDOMEN AND PELVIS WITHOUT CONTRAST TECHNIQUE: Multidetector CT imaging of the abdomen and pelvis was performed following the standard protocol without IV contrast. COMPARISON:  None. FINDINGS: Lower chest: No pulmonary nodules or pleural effusion. No visible pericardial effusion. Hepatobiliary: Normal noncontrast appearance of the liver. No visible biliary dilatation. Normal gallbladder. Pancreas: Normal noncontrast appearance of the pancreas. No peripancreatic fluid collection. Spleen: Normal. Adrenals/Urinary Tract: --Adrenal glands: Normal. --Right kidney/ureter: No hydronephrosis or perinephric stranding. No nephrolithiasis. No obstructing ureteral stones. --Left kidney/ureter: There is severe left hydroureteronephrosis. There is no obstructing stone within the ureter or at the ureterovesical junction. There are multiple nonobstructing left renal stones. A stone at  the upper pole measures 8 mm. Stone the lower pole measures 5 mm. A third stone measures 5 mm. There is no perinephric stranding. --Urinary bladder: Partially decompressed. No focal abnormality visualized. Stomach/Bowel: There is no hiatal hernia. The stomach and duodenum are normal. There is no dilated small bowel or enteric inflammation. There is no colonic abnormality. The appendix is normal. Vascular/Lymphatic: No abdominal aortic aneurysm or atherosclerotic calcification. No abdominal or pelvic lymphadenopathy. Reproductive: Normal uterus and ovaries. Musculoskeletal. No focal osseous lesion. Normal visualized extraperitoneal and extrathoracic soft tissues. IMPRESSION: 1. Severe left hydroureteronephrosis without a visible source of obstruction. No ureteral or ureterovesical junction stones. Additionally, the lack of perinephric stranding may indicate that this is a chronic process. If there is prior imaging of the GU tract from another institution, comparison would be helpful. Urological consultation and/or cystoscopy could be considered to evaluate for potential lesion of the ureteral orifice, if clinically warranted. 2. Multiple nonobstructing left renal stones measuring up to 8 mm. Electronically Signed   By: Deatra RobinsonKevin  Herman M.D.   On: 05/01/2017 16:34     IMPRESSION AND PLAN:   * UTI with left hydronephrosis. No stones seen on CAT scan. Could have stricture due to prior stones and procedures. Neurology has been consulted and patient is nothing by mouth at this time for going to the operating room later. Start IV ceftriaxone. Bolus 1 L normal saline now and continue maintenance fluids. Lactic acid pending. We'll repeat labs in the morning. Urine culture sent and pending. Her changes on the CT scan could be chronic. Will need [facility] [place] We'll wait for urology input.  DVT prophylaxis with  SCDs. Lovenox after procedure.  All the  records are reviewed and case discussed with ED provider. Management plans discussed with the patient, family and they are in agreement.  CODE STATUS: FULL CODE  TOTAL TIME TAKING CARE OF THIS PATIENT: 40 minutes.   Milagros Loll R M.D on 05/01/2017 at 6:25 PM  Between 7am to 6pm - Pager - [phone]  After 6pm go to [email or web address] - password [email or web address]  [facility]  Office  [phone]  CC: Primary care physician; Patient, No Pcp Per  Note: This dictation was prepared with Dragon dictation along with smaller phrase technology. Any transcriptional errors that result from this process are unintentional.

## 2017-05-01 NOTE — ED Notes (Signed)
States she also had hx of kidney stones recently and had surgery  But has had increased lower back pain over the past few days  No fever or n/v

## 2017-05-01 NOTE — ED Notes (Signed)
Urologist at bedside.

## 2017-05-01 NOTE — ED Notes (Signed)
Patient dressed in only gown by Judeth CornfieldStephanie, tech.  Brandy from OR stated patient will be transported by orderly at approx. 2030 for 2100 surgery.

## 2017-05-02 DIAGNOSIS — N132 Hydronephrosis with renal and ureteral calculous obstruction: Secondary | ICD-10-CM

## 2017-05-02 DIAGNOSIS — R1032 Left lower quadrant pain: Secondary | ICD-10-CM

## 2017-05-02 MED ORDER — IBUPROFEN 600 MG PO TABS: 600.0000 mg | ORAL_TABLET | Freq: Four times a day (QID) | ORAL | 0 refills | Status: DC | PRN

## 2017-05-02 MED ORDER — DEXTROSE 5 % IV SOLN
1.0000 g | INTRAVENOUS | Status: DC
  Filled 2017-05-02: qty 10

## 2017-05-02 MED ORDER — CIPROFLOXACIN HCL 500 MG PO TABS: 500.0000 mg | ORAL_TABLET | Freq: Two times a day (BID) | ORAL | 0 refills | Status: DC

## 2017-05-02 NOTE — Progress Notes (Signed)
[facility]         [place], [place]   05/02/2017  Patient: Kathleen Hutchinson   Date of Birth:  [birth year]  Date of admission:  05/01/2017  Date of Discharge  05/02/2017    To Whom it May Concern:   Kathleen Hutchinson  may return to work on 05/04/2017.  Her medical condition can cause her to take frequent bathroom breaks.  If you have any questions or concerns, please don't hesitate to call.  Sincerely,   Milagros LollSudini, Kalin Kyler R M.D Office : [phone]   .

## 2017-05-02 NOTE — Progress Notes (Signed)
Urology Inpatient Progress Report  Hydroureteronephrosis [N13.30]  Procedure(s): CYSTOSCOPY WITH STENT PLACEMENT CYSTOSCOPY WITH RETROGRADE PYELOGRAM  1 Day Post-Op   Intv/Subj: No acute events overnight. 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 0.9 % NaCl with KCl 20 mEq/ L  infusion   Intravenous Continuous Milagros Loll, MD 100 mL/hr at 05/01/17 2342    . acetaminophen (TYLENOL) tablet 650 mg  650 mg Oral Q6H PRN Milagros Loll, MD       Or  . acetaminophen (TYLENOL) suppository 650 mg  650 mg Rectal Q6H PRN Sudini, Srikar, MD      . cefTRIAXone (ROCEPHIN) IVPB 1 g  1 g Intravenous Q24H Sudini, Srikar, MD      . fentaNYL (SUBLIMAZE) 100 MCG/2ML injection           . HYDROcodone-acetaminophen (NORCO/VICODIN) 5-325 MG per tablet 1 tablet  1 tablet Oral Q4H PRN Bjorn Pippin, MD      . ibuprofen (ADVIL,MOTRIN) tablet 400 mg  400 mg Oral Q6H PRN Milagros Loll, MD   400 mg at 05/01/17 2342  . ipratropium-albuterol (DUONEB) 0.5-2.5 (3) MG/3ML nebulizer solution           . ketorolac (TORADOL) 30 MG/ML injection 30 mg  30 mg Intravenous Q6H PRN Sudini, Wardell Heath, MD      . ondansetron (ZOFRAN) tablet 4 mg  4 mg Oral Q6H PRN Sudini, Wardell Heath, MD       Or  . ondansetron (ZOFRAN) injection 4 mg  4 mg Intravenous Q6H PRN Sudini, Srikar, MD      . polyethylene glycol (MIRALAX / GLYCOLAX) packet 17 g  17 g Oral Daily PRN Milagros Loll, MD         Objective: Vital: Vitals:   05/01/17 2236 05/01/17 2306 05/02/17 0004 05/02/17 0445  BP: 92/60 98/66 98/66  (!) 96/56  Pulse: 85 89 78 65  Resp: 16 16 16 16   Temp: 98.3 F (36.8 C) 97.9 F (36.6 C) 98.5 F (36.9 C)  97.8 F (36.6 C)  TempSrc: Oral Oral Oral Oral  SpO2: 97% 98% 99% 97%  Weight:   94.8 kg (209 lb)   Height:   5\' 8"  (1.727 m)    I/Os: I/O last 3 completed shifts: In: 670 [P.O.:240; I.V.:430] Out: 900 [Urine:900]  Physical Exam:  General: Patient is in no apparent distress Lungs: Normal respiratory effort, chest expands symmetrically. The abdomen is soft and nontender without mass. Ext: lower extremities symmetric  Lab Results:  Recent Labs  05/01/17 1710 05/01/17 2309  WBC 9.9 12.1*  HGB 14.4 13.0  HCT 40.9 38.0    Recent Labs  05/01/17 1710 05/01/17 2309  NA 134* 137  K 3.5 3.6  CL 101 105  CO2 26 25  GLUCOSE 98 111*  BUN 9 9  CREATININE 0.72 0.75  CALCIUM 9.0 8.5*   No results for input(s): LABPT, INR in the last 72 hours. No results for input(s): LABURIN in the last 72 hours. No results found for this or any previous visit.  Studies/Results: Ct Renal Stone Study  Result Date: 05/01/2017 CLINICAL DATA:  Urinary frequency and low back pain. EXAM: CT ABDOMEN AND PELVIS WITHOUT CONTRAST TECHNIQUE: Multidetector CT imaging of the abdomen and  pelvis was performed following the standard protocol without IV contrast. COMPARISON:  None. FINDINGS: Lower chest: No pulmonary nodules or pleural effusion. No visible pericardial effusion. Hepatobiliary: Normal noncontrast appearance of the liver. No visible biliary dilatation. Normal gallbladder. Pancreas: Normal noncontrast appearance of the pancreas. No peripancreatic fluid collection. Spleen: Normal. Adrenals/Urinary Tract: --Adrenal glands: Normal. --Right kidney/ureter: No hydronephrosis or perinephric stranding. No nephrolithiasis. No obstructing ureteral stones. --Left kidney/ureter: There is severe left hydroureteronephrosis. There is no obstructing stone within the ureter or at the ureterovesical junction. There are multiple nonobstructing left renal stones. A stone at the upper pole measures 8 mm. Stone the lower pole  measures 5 mm. A third stone measures 5 mm. There is no perinephric stranding. --Urinary bladder: Partially decompressed. No focal abnormality visualized. Stomach/Bowel: There is no hiatal hernia. The stomach and duodenum are normal. There is no dilated small bowel or enteric inflammation. There is no colonic abnormality. The appendix is normal. Vascular/Lymphatic: No abdominal aortic aneurysm or atherosclerotic calcification. No abdominal or pelvic lymphadenopathy. Reproductive: Normal uterus and ovaries. Musculoskeletal. No focal osseous lesion. Normal visualized extraperitoneal and extrathoracic soft tissues. IMPRESSION: 1. Severe left hydroureteronephrosis without a visible source of obstruction. No ureteral or ureterovesical junction stones. Additionally, the lack of perinephric stranding may indicate that this is a chronic process. If there is prior imaging of the GU tract from another institution, comparison would be helpful. Urological consultation and/or cystoscopy could be considered to evaluate for potential lesion of the ureteral orifice, if clinically warranted. 2. Multiple nonobstructing left renal stones measuring up to 8 mm. Electronically Signed   By: Deatra RobinsonKevin  Herman M.D.   On: 05/01/2017 16:34    Assessment: Procedure(s): CYSTOSCOPY WITH STENT PLACEMENT CYSTOSCOPY WITH RETROGRADE PYELOGRAM, 1 Day Post-Op  doing well.  Plan: No fevers s/p stent placement for obstructed left ureter.  I would transition her to oral antibiotics and monitor her for fevers over the course of the day.  She can then go home as long as she is afebrile.  We can [facility] with [facility] closely [facility]  We will pull her stent in clinic and see how she does.  We will call her Monday with follow-up.    Berniece SalinesBenjamin Herrick, MD Urology 05/02/2017, 7:11 AM

## 2017-05-02 NOTE — Progress Notes (Signed)
Pt d/c home; d/c instructions reviewed w/ pt; pt understanding was verbalized; IV removed, catheter in tact, gauze dressing applied; all pt questions answered; pt verbalized that all pt belongings were accounted for; pt left unit via wheelchair accompanied by staff 

## 2017-05-02 NOTE — Anesthesia Postprocedure Evaluation (Signed)
Anesthesia Post Note  Patient: Kathleen Hutchinson  Procedure(s) Performed: Procedure(s) (LRB): CYSTOSCOPY WITH STENT PLACEMENT (Left) CYSTOSCOPY WITH RETROGRADE PYELOGRAM (Left)  Patient location during evaluation: PACU Anesthesia Type: General Level of consciousness: awake and alert Pain management: pain level controlled Vital Signs Assessment: post-procedure vital signs reviewed and stable Respiratory status: spontaneous breathing, nonlabored ventilation, respiratory function stable and patient connected to nasal cannula oxygen Cardiovascular status: blood pressure returned to baseline and stable Postop Assessment: no signs of nausea or vomiting Anesthetic complications: no     Last Vitals:  Vitals:   05/02/17 0004 05/02/17 0445  BP: 98/66 (!) 96/56  Pulse: 78 65  Resp: 16 16  Temp: 36.9 C 36.6 C    Last Pain:  Vitals:   05/02/17 0445  TempSrc: Oral  PainSc:                  Cleda MccreedyJoseph K Piscitello

## 2017-05-03 LAB — HIV ANTIBODY (ROUTINE TESTING W REFLEX): HIV SCREEN 4TH GENERATION: NONREACTIVE

## 2017-05-04 LAB — URINE CULTURE

## 2017-05-05 ENCOUNTER — Encounter: Payer: Self-pay | Admitting: Urology

## 2017-05-05 ENCOUNTER — Telehealth: Payer: Self-pay | Admitting: Radiology

## 2017-05-05 NOTE — Telephone Encounter (Signed)
-----   Message from Bjorn PippinJohn Wrenn, MD sent at 05/01/2017  9:34 PM EDT ----- This patient had a left stent placed on 5/25 and will need f/u in 1-2 weeks with one of the docs.     She [place] prior ure[place] and it would be best to get the details from the patient and obtain her prior urologist and hospital records as well as any films from that time frame.

## 2017-05-05 NOTE — Op Note (Signed)
Kathleen Hutchinson, Kathleen Hutchinson               ACCOUNT NO.:  [id number]  MEDICAL RECORD NO.:  [id number]  LOCATION:                                 FACILITY:  PHYSICIAN:  Excell Seltzer. Annabell Howells, M.D.    DATE OF BIRTH:  Jul 08, 1986  DATE OF PROCEDURE:  05/01/2017 DATE OF DISCHARGE:                              OPERATIVE REPORT   Patient of Dr. Elpidio Anis  PROCEDURE PERFORMED:  Cystoscopy with left retrograde pyelogram and insertion of left double-J.  SURGEON:  Excell Seltzer. Annabell Howells, M.D.  PREOPERATIVE DIAGNOSIS:  Left hydronephrosis with pain and urinary tract infection.  POSTOPERATIVE DIAGNOSIS:  Left hydronephrosis with pain and urinary tract infection with left distal ureteral stricture.  ANESTHESIA:  General.  SPECIMEN:  None.  DRAINS:  A 6-French x 24 cm left double-J stent without tether.  COMPLICATIONS:  None.  INDICATIONS:  [name] is a 31 year old white female who presented with a 2-week history of left flank pain that became progressively more severe.  She was found on CT scan to have marked dilation of the left kidney with some intrarenal stones and a very dilated distal ureter but no distal ureteral stone.  She has had ureteroscopy in October and it was felt that she had a probable distal ureteral stricture.  She also had a urinary tract infection and had some chills, so I felt that cystoscopy and stenting were indicated urgently.  DESCRIPTION OF PROCEDURE:  Findings and procedure:  She was taken to the operating room where general anesthetic was induced.  She received Rocephin and Cipro.  She was placed in lithotomy position and was fitted with a PAS hose.  Her perineum and genitalia were prepped with Betadine solution.  She was draped in usual sterile fashion.  Cystoscopy was performed using the 21-French scope and 30-degree lens. Examination revealed a normal urethra.  The urine was turbid.  The bladder was drained and filled with irrigant.  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  Left ureteral orifice was unremarkable.  The left ureteral orifice was cannulated with a 5-French open-end catheter and contrast was instilled.  This revealed some narrowing of the intramural ureter with marked dilation of the distal ureter, but I did not instill excessive contrast since the anatomy was reasonably well defined on the recent CT.  Once I determined that there was flow to the kidney in a retrograde fashion, a Sensor guidewire was passed to the kidney without difficulty. The open-end catheter was removed and a 6-French 24 cm Contour double-J stent was inserted to the kidney under fluoroscopic guidance without difficulty.  The wire was removed leaving good coil in the kidney and good coil in the bladder.  The bladder was then drained.  The cystoscope was removed.  The patient was taken down from lithotomy position.  Her anesthetic was reversed. She was moved to recovery in stable condition.  There were no complications.  On findings, it appeared she did have a distal ureteral narrowing.  However, it is possible she could have some form of congenital megaureters well.  We do not have prior films for comparison, but we will need to try to get those.  She will need stent removal and possible ureteroscopy with removal of the renal stones at later date.     Excell SeltzerJohn J. Annabell HowellsWrenn, M.D.   ______________________________ Excell SeltzerJohn J. Annabell HowellsWrenn, M.D.    JJW/MEDQ  D:  05/01/2017  T:  05/01/2017  Job:  [id number]

## 2017-05-05 NOTE — Discharge Summary (Signed)
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 Headaches, cluster   . Nephrolithiasis      ADMITTING HISTORY  HISTORY OF PRESENT ILLNESS:  Kathleen Hutchinson  is a 31 y.o. female with a known history of Headaches, nephrolithiasis with prior left ureteral stent in October 2017 presents to the hospital complaining of left flank back pain and dysuria. Patient feels her symptoms are similar to the last time she had stones. Here patient has been found to have UTI. CT scan shows severe left hydroureteronephrosis but no obstructing stones. Afebrile. White count is 9.9. But tachycardic at 104. Lactic acid pending. Urology has been consulted and patient will be going to the OR later today. Presently nothing by mouth.   HOSPITAL COURSE:   * Left hydroureteronephrosis Patient had a CT scan done which showed no stone. Likely stricture. She had cystoscopy with left ureteral stent placement by urology. Associated UTI. Ciprofloxacin at time of discharge. She was on IV antibiotics in the hospital. He did have left flank pain on admission which has resolved after stent placement. She is being discharged home to follow-up with urology in 2 weeks.  CONSULTS OBTAINED:  Treatment Team:  Bjorn Pippin, MD  DRUG ALLERGIES:   Allergies  Allergen Reactions  . Augmentin [Amoxicillin-Pot Clavulanate] Rash    DISCHARGE MEDICATIONS:   Discharge Medication  List as of 05/02/2017  9:17 AM    START taking these medications   Details  ciprofloxacin (CIPRO) 500 MG tablet Take 1 tablet (500 mg total) by mouth 2 (two) times daily., Starting Sat 05/02/2017, Until Tue 05/12/2017, Print      CONTINUE these medications which have CHANGED   Details  ibuprofen (ADVIL,MOTRIN) 600 MG tablet Take 1 tablet (600 mg total) by mouth every 6 (six) hours as needed (Pain)., Starting Sat 05/02/2017, Print      CONTINUE these medications which have NOT CHANGED   Details  Multiple Vitamin (MULTIVITAMIN) tablet Take 1 tablet by mouth daily., Historical Med        Today   VITAL SIGNS:  Blood pressure (!) 96/56, pulse 65, temperature 97.8 F (36.6 C), temperature source Oral, resp. rate 16, height 5\' 8"  (1.727 m), weight 94.8 kg (209 lb), last menstrual period 04/13/2017, SpO2 97 %.  I/O:  No intake or output data in the 24 hours ending 05/05/17 1059  PHYSICAL EXAMINATION:  Physical Exam  GENERAL:  31 y.o.-year-old patient lying in the bed with no acute distress.  LUNGS: Normal breath sounds bilaterally, no wheezing, rales,rhonchi or crepitation. No use of accessory muscles of respiration.  CARDIOVASCULAR: S1, S2 normal. No murmurs, rubs, or gallops.  ABDOMEN: Soft, non-tender, non-distended. Bowel sounds present. No organomegaly or mass.  NEUROLOGIC: Moves all 4 extremities. PSYCHIATRIC: The patient is alert and oriented x 3.  SKIN: No obvious rash, lesion, or ulcer.   DATA REVIEW:   CBC  Recent Labs Lab 05/01/17 2309  WBC 12.1*  HGB 13.0  HCT  38.0  PLT 203    Chemistries   Recent Labs Lab 05/01/17 1710 05/01/17 2309  NA 134* 137  K 3.5 3.6  CL 101 105  CO2 26 25  GLUCOSE 98 111*  BUN 9 9  CREATININE 0.72 0.75  CALCIUM 9.0 8.5*  AST 21  --   ALT 20  --   ALKPHOS 72  --   BILITOT 0.8  --     Cardiac Enzymes No results for input(s): TROPONINI in the last 168 hours.  Microbiology Results  Results for orders placed or performed  during the hospital encounter of 05/01/17  Culture, Urine     Status: Abnormal   Collection Time: 05/01/17  3:32 PM  Result Value Ref Range Status   Specimen Description URINE, CLEAN CATCH  Final   Special Requests NONE  Final   Culture >=100,000 COLONIES/mL ESCHERICHIA COLI (A)  Final   Report Status 05/04/2017 FINAL  Final   Organism ID, Bacteria ESCHERICHIA COLI (A)  Final      Susceptibility   Escherichia coli - MIC*    AMPICILLIN >=32 RESISTANT Resistant     CEFAZOLIN 32 INTERMEDIATE Intermediate     CEFTRIAXONE <=1 SENSITIVE Sensitive     CIPROFLOXACIN <=0.25 SENSITIVE Sensitive     GENTAMICIN <=1 SENSITIVE Sensitive     IMIPENEM <=0.25 SENSITIVE Sensitive     NITROFURANTOIN <=16 SENSITIVE Sensitive     TRIMETH/SULFA <=20 SENSITIVE Sensitive     AMPICILLIN/SULBACTAM >=32 RESISTANT Resistant     PIP/TAZO >=128 RESISTANT Resistant     Extended ESBL NEGATIVE Sensitive     * >=100,000 COLONIES/mL ESCHERICHIA COLI    RADIOLOGY:  No results found.  Follow up with PCP in 1 week.  Management plans discussed with the patient, family and they are in agreement.  CODE STATUS:  Code Status History    Date Active Date Inactive Code Status Order ID Comments User Context   05/01/2017  6:24 PM 05/02/2017  3:16 PM Full Code [id number]  Milagros LollSudini, Tino Ronan, MD ED      TOTAL TIME TAKING CARE OF THIS PATIENT ON DAY OF DISCHARGE: more than 30 minutes.   Milagros LollSudini, Antonia Culbertson R M.D on 05/05/2017 at 10:59 AM  Between 7am to 6pm - Pager - [phone]  After 6pm go to [email or web address] - password [email or web address]  [facility]  Office  [phone]  CC: Primary care physician; Patient, No Pcp Per  Note: This dictation was prepared with Dragon dictation along with smaller phrase technology. Any transcriptional errors that result from this process are unintentional.

## 2017-05-05 NOTE — Telephone Encounter (Signed)
Appt made.  Pt aware.

## 2017-05-12 ENCOUNTER — Ambulatory Visit (INDEPENDENT_AMBULATORY_CARE_PROVIDER_SITE_OTHER): Payer: Self-pay | Admitting: Urology

## 2017-05-12 ENCOUNTER — Encounter: Payer: Self-pay | Admitting: Urology

## 2017-05-12 VITALS — BP 129/82 | HR 91 | Ht 68.0 in | Wt 200.0 lb

## 2017-05-12 DIAGNOSIS — N3289 Other specified disorders of bladder: Secondary | ICD-10-CM

## 2017-05-12 DIAGNOSIS — N200 Calculus of kidney: Secondary | ICD-10-CM

## 2017-05-12 DIAGNOSIS — N261 Atrophy of kidney (terminal): Secondary | ICD-10-CM

## 2017-05-12 LAB — URINALYSIS, COMPLETE
Bilirubin, UA: NEGATIVE
Glucose, UA: NEGATIVE
Ketones, UA: NEGATIVE
NITRITE UA: NEGATIVE
PH UA: 6.5 (ref 5.0–7.5)
Specific Gravity, UA: 1.015 (ref 1.005–1.030)
UUROB: 0.2 mg/dL (ref 0.2–1.0)

## 2017-05-12 LAB — MICROSCOPIC EXAMINATION: WBC, UA: NONE SEEN /hpf (ref 0–?)

## 2017-05-12 MED ORDER — TAMSULOSIN HCL 0.4 MG PO CAPS: 0.4000 mg | ORAL_CAPSULE | Freq: Every day | ORAL | 0 refills | Status: DC

## 2017-05-12 MED ORDER — OXYBUTYNIN CHLORIDE 5 MG PO TABS: 5.0000 mg | ORAL_TABLET | Freq: Three times a day (TID) | ORAL | 0 refills | Status: DC | PRN

## 2017-05-12 NOTE — Progress Notes (Signed)
05/12/2017 4:23 PM   Kathleen Hutchinson [id number]  Referring provider: No referring provider defined for this encounter.  Chief Complaint  Patient presents with  . Nephrolithiasis    New Patient    HPI: 31 year old female who presents today for hospital follow-up.  She presented to the emergency room on 05/01/2017 with left flank pain 3 days, dysuria, urgency and frequency.  On CT scan, she was found to have severe hydroureteronephrosis down to the level of the EJ without apparent stone. She also had evidence of urinary tract infection with a positive urinalysis and ultimately grew Escherichia coli resistant to ampicillin and Zosyn.  She was taken urgently to the OR for left ureteral stent placement. She improved clinically and was discharged with urologic outpatient follow-up.  She has a personal history of nephrolithiasis and underwent ureteroscopy in October 2017 in [place]. She is since moved her first social reasons.  I did call her urologist in [place], Dr. Toribio Harbour for further he explained that she had an obstructing distal ureteral stone back in 2015. She was scheduled for ureteroscopy but on the day of the procedure, she left the preoperative holding area refused to have surgery. She returned 2 years later and ultimately underwent ureteroscopy. Stent remained in place for 2 weeks and was subsequently removed. She never followed up thereafter.   She confirm this history as well.  She reports that she was scared about surgery, thought she was given habitus incision then backed out.  She has had some irritation from the stent. She reports urgency and frequency. She has no fevers or chills. She is taking her antibiotics as prescribed.    PMH: Past Medical History:  Diagnosis Date  . Headaches, cluster   . Nephrolithiasis     Surgical History: Past Surgical History:  Procedure Laterality Date  . CESAREAN SECTION    . CYSTOSCOPY W/ RETROGRADES Left 05/01/2017   Procedure: CYSTOSCOPY WITH RETROGRADE PYELOGRAM;  Surgeon: Bjorn Pippin, MD;  Location: [facility] ORS;  Service: Urology;  Laterality: Left;  . CYSTOSCOPY WITH STENT PLACEMENT Left 05/01/2017   Procedure: CYSTOSCOPY WITH STENT PLACEMENT;  Surgeon: Bjorn Pippin, MD;  Location: [facility] ORS;  Service: Urology;  Laterality: Left;  . CYSTOSCOPY/URETEROSCOPY/HOLMIUM LASER/STENT PLACEMENT Left 10/02/2016  . TUBAL LIGATION    . VAGINAL DELIVERY     x 4    Home Medications:  Allergies as of 05/12/2017      Reactions   Augmentin [amoxicillin-pot Clavulanate] Rash      Medication List       Accurate as of 05/12/17 11:59 PM. Always use your most recent med list.          multivitamin tablet Take 1 tablet by mouth daily.   oxybutynin 5 MG tablet Commonly known as:  DITROPAN Take 1 tablet (5 mg total) by mouth every 8 (eight) hours as needed for bladder spasms.   tamsulosin 0.4 MG Caps capsule Commonly known as:  FLOMAX Take 1 capsule (0.4 mg total) by mouth daily.       Allergies:  Allergies  Allergen Reactions  . Augmentin [Amoxicillin-Pot Clavulanate] Rash    Family History: Family History  Problem Relation Age of Onset  . Cholecystitis Mother   . Nephrolithiasis Neg Hx   . Migraines Neg Hx     Social History:  reports that she has been smoking.  She has never used smokeless tobacco. She reports that she does not drink alcohol or use drugs.  ROS: UROLOGY Frequent Urination?: Yes Hard  to postpone urination?: No Burning/pain with urination?: No Get up at night to urinate?: Yes Leakage of urine?: Yes Urine stream starts and stops?: Yes Trouble starting stream?: No Do you have to strain to urinate?: No Blood in urine?: No Urinary tract infection?: Yes Sexually transmitted disease?: No Injury to kidneys or bladder?: Yes Painful intercourse?: No Weak stream?: No Currently pregnant?: No Vaginal bleeding?: No Last menstrual period?: 05/09/17  Gastrointestinal Nausea?:  No Vomiting?: No Indigestion/heartburn?: No Diarrhea?: No Constipation?: No  Constitutional Fever: No Night sweats?: No Weight loss?: No Fatigue?: No  Skin Skin rash/lesions?: No Itching?: No  Eyes Blurred vision?: No Double vision?: No  Ears/Nose/Throat Sore throat?: No Sinus problems?: No  Hematologic/Lymphatic Swollen glands?: No Easy bruising?: No  Cardiovascular Leg swelling?: No Chest pain?: No  Respiratory Cough?: No Shortness of breath?: No  Endocrine Excessive thirst?: No  Musculoskeletal Back pain?: Yes Joint pain?: No  Neurological Headaches?: Yes Dizziness?: No  Psychologic Depression?: No Anxiety?: No  Physical Exam: BP 129/82   Pulse 91   Ht 5\' 8"  (1.727 m)   Wt 200 lb (90.7 kg)   LMP 04/13/2017 (Exact Date)   BMI 30.41 kg/m   Constitutional:  Alert and oriented, No acute distress. HEENT: [place] AT, moist mucus membranes.  Trachea midline, no masses. Cardiovascular: No clubbing, cyanosis, or edema. Respiratory: Normal respiratory effort, no increased work of breathing. GI: Abdomen is soft, nontender, nondistended, no abdominal masses GU: No CVA tenderness.  Skin: No rashes, bruises or suspicious lesions. Neurologic: Grossly intact, no focal deficits, moving all 4 extremities. Psychiatric: Normal mood and affect.  Laboratory Data: Lab Results  Component Value Date   WBC 12.1 (H) 05/01/2017   HGB 13.0 05/01/2017   HCT 38.0 05/01/2017   MCV 90.2 05/01/2017   PLT 203 05/01/2017    Lab Results  Component Value Date   CREATININE 0.75 05/01/2017    Urinalysis Results for orders placed or performed in visit on 05/12/17  CULTURE, URINE COMPREHENSIVE  Result Value Ref Range   Urine Culture, Comprehensive Final report    Organism ID, Bacteria Comment   Microscopic Examination  Result Value Ref Range   WBC, UA None seen 0 - 5 /hpf   RBC, UA >30 (H) 0 - 2 /hpf   Epithelial Cells (non renal) 0-10 0 - 10 /hpf   Bacteria, UA Few  (A) None seen/Few  Urinalysis, Complete  Result Value Ref Range   Specific Gravity, UA 1.015 1.005 - 1.030   pH, UA 6.5 5.0 - 7.5   Color, UA Red (A) Yellow   Appearance Ur Cloudy (A) Clear   Leukocytes, UA 1+ (A) Negative   Protein, UA 2+ (A) Negative/Trace   Glucose, UA Negative Negative   Ketones, UA Negative Negative   RBC, UA 3+ (A) Negative   Bilirubin, UA Negative Negative   Urobilinogen, Ur 0.2 0.2 - 1.0 mg/dL   Nitrite, UA Negative Negative   Microscopic Examination See below:      Pertinent Imaging: CLINICAL DATA:  Urinary frequency and low back pain.  EXAM: CT ABDOMEN AND PELVIS WITHOUT CONTRAST  TECHNIQUE: Multidetector CT imaging of the abdomen and pelvis was performed following the standard protocol without IV contrast.  COMPARISON:  None.  FINDINGS: Lower chest: No pulmonary nodules or pleural effusion. No visible pericardial effusion.  Hepatobiliary: Normal noncontrast appearance of the liver. No visible biliary dilatation. Normal gallbladder.  Pancreas: Normal noncontrast appearance of the pancreas. No peripancreatic fluid collection.  Spleen: Normal.  Adrenals/Urinary  Tract:  --Adrenal glands: Normal.  --Right kidney/ureter: No hydronephrosis or perinephric stranding. No nephrolithiasis. No obstructing ureteral stones.  --Left kidney/ureter: There is severe left hydroureteronephrosis. There is no obstructing stone within the ureter or at the ureterovesical junction. There are multiple nonobstructing left renal stones. A stone at the upper pole measures 8 mm. Stone the lower pole measures 5 mm. A third stone measures 5 mm. There is no perinephric stranding.  --Urinary bladder: Partially decompressed. No focal abnormality visualized.  Stomach/Bowel: There is no hiatal hernia. The stomach and duodenum are normal. There is no dilated small bowel or enteric inflammation. There is no colonic abnormality. The appendix is  normal.  Vascular/Lymphatic: No abdominal aortic aneurysm or atherosclerotic calcification. No abdominal or pelvic lymphadenopathy.  Reproductive: Normal uterus and ovaries.  Musculoskeletal. No focal osseous lesion. Normal visualized extraperitoneal and extrathoracic soft tissues.  IMPRESSION: 1. Severe left hydroureteronephrosis without a visible source of obstruction. No ureteral or ureterovesical junction stones. Additionally, the lack of perinephric stranding may indicate that this is a chronic process. If there is prior imaging of the GU tract from another institution, comparison would be helpful. Urological consultation and/or cystoscopy could be considered to evaluate for potential lesion of the ureteral orifice, if clinically warranted. 2. Multiple nonobstructing left renal stones measuring up to 8 mm.   Electronically Signed   By: Deatra Robinson M.D.   On: 05/01/2017 16:34  CT scan personally reviewed today and with the patient.  Case was discussed with her urologist in [place], Dr. Maurine Minister  Assessment & Plan:    1. Left nephrolithiasis Nonobstructing left left renal calculi, we will determine plan for intervention based on #2 - Urinalysis, Complete - CULTURE, URINE COMPREHENSIVE  2. Left renal atrophy with severe hydroureteronephrosis secondary to ureteral stricture Suspect left distal ureteral stricture 2/2 damage from chronically obstructing stone which has now been addressed Stent in place Recommend Lasix renogram to assess overall function of left kidney given to degree of atrophy in order to assess how to proceed (repair vs. Nephrectomy) She is agreeable with this plan and will return after this study - [facility] Renal Imag Wo/W Pharm; Future  3. Bladder spasm Recommend Flomax and Ditropan for bladder spasms, was not discharged with either of these medications   RTC following Lasix renogram to discuss results  Vanna Scotland, MD  [facility]  I spent 25 min with this patient of which greater than 50% was spent in counseling and coordination of care with the patient.   [address] [place], [place] [address] [phone]

## 2017-05-14 LAB — CULTURE, URINE COMPREHENSIVE

## 2017-05-21 ENCOUNTER — Ambulatory Visit: Payer: Medicaid Other | Admitting: Urology

## 2017-05-29 ENCOUNTER — Ambulatory Visit
Admission: RE | Admit: 2017-05-29 | Discharge: 2017-05-29 | Disposition: A | Discharge status: Home / Self Care | Payer: Medicaid Other | Source: Ambulatory Visit | Attending: Urology | Admitting: Urology

## 2017-05-29 DIAGNOSIS — N261 Atrophy of kidney (terminal): Secondary | ICD-10-CM

## 2017-05-29 MED ORDER — TECHNETIUM TC 99M MERTIATIDE
5.2100 | Freq: Once | INTRAVENOUS | Status: AC | PRN
  Administered 2017-05-29: 5.21 via INTRAVENOUS

## 2017-05-29 MED ORDER — FUROSEMIDE 10 MG/ML IJ SOLN
20.0000 mg | Freq: Once | INTRAMUSCULAR | Status: DC
  Filled 2017-05-29: qty 2

## 2017-06-04 ENCOUNTER — Other Ambulatory Visit: Payer: Self-pay | Admitting: Radiology

## 2017-06-04 ENCOUNTER — Encounter: Payer: Self-pay | Admitting: Radiology

## 2017-06-04 ENCOUNTER — Encounter: Payer: Self-pay | Admitting: Urology

## 2017-06-04 ENCOUNTER — Ambulatory Visit (INDEPENDENT_AMBULATORY_CARE_PROVIDER_SITE_OTHER): Payer: Self-pay | Admitting: Urology

## 2017-06-04 VITALS — BP 130/76 | HR 103 | Ht 68.0 in | Wt 210.0 lb

## 2017-06-04 DIAGNOSIS — N261 Atrophy of kidney (terminal): Secondary | ICD-10-CM

## 2017-06-04 DIAGNOSIS — N3289 Other specified disorders of bladder: Secondary | ICD-10-CM

## 2017-06-04 DIAGNOSIS — N200 Calculus of kidney: Secondary | ICD-10-CM

## 2017-06-05 ENCOUNTER — Telehealth: Payer: Self-pay | Admitting: Radiology

## 2017-06-05 ENCOUNTER — Other Ambulatory Visit: Payer: Self-pay | Admitting: Radiology

## 2017-06-05 DIAGNOSIS — N135 Crossing vessel and stricture of ureter without hydronephrosis: Secondary | ICD-10-CM

## 2017-06-05 DIAGNOSIS — N200 Calculus of kidney: Secondary | ICD-10-CM

## 2017-06-05 NOTE — Telephone Encounter (Signed)
LMOM. Need to discuss upcoming surgery information. 

## 2017-06-05 NOTE — Progress Notes (Signed)
06/04/2017 8:26 AM   Kathleen Hutchinson 1986-10-06 [id number]  Referring provider: No referring provider defined for this encounter.  Chief Complaint  Patient presents with  . Results    NM study results    HPI: 31 year old female Who returns to the office today to follow-up Lasix renogram for further diagnostic evaluation of severe left hydronephrosis, left ureteral stricture and atrophy.  MAG3 Lasix renogram shows excellent function of the left kidney, 47% on the left versus 53% of the right.  There was very mild delayed uptake concentration, and excretion of the radiotracer as well as an appreciable dilated left collecting system.  She does have the usual stent irritation but otherwise has no complaints today.  Previous history She presented to the emergency room on 05/01/2017 with left flank pain 3 days, dysuria, urgency and frequency.  On CT scan, she was found to have severe hydroureteronephrosis down to the level of the UVJ without apparent stone. She also had evidence of urinary tract infection with a positive urinalysis and ultimately grew Escherichia coli resistant to ampicillin and Zosyn.  She was taken urgently to the OR for left ureteral stent placement. She improved clinically and was discharged with urologic outpatient follow-up.  She has a personal history of nephrolithiasis and underwent ureteroscopy in October 2017 in [place]. She is since moved her first social reasons.  Prior to above, she did have an obstructing distal ureteral stone back in 2015. She was scheduled for ureteroscopy with Dr. Toribio Harbour in [place] but on the day of the procedure, she left the preoperative holding area refused to have surgery. She returned 2 years later and ultimately underwent ureteroscopy. Stent remained in place for 2 weeks and was subsequently removed. She never followed up thereafter.    PMH: Past Medical History:  Diagnosis Date  . Headaches, cluster   . Nephrolithiasis      Surgical History: Past Surgical History:  Procedure Laterality Date  . CESAREAN SECTION    . CYSTOSCOPY W/ RETROGRADES Left 05/01/2017   Procedure: CYSTOSCOPY WITH RETROGRADE PYELOGRAM;  Surgeon: Bjorn Pippin, MD;  Location: [facility] ORS;  Service: Urology;  Laterality: Left;  . CYSTOSCOPY WITH STENT PLACEMENT Left 05/01/2017   Procedure: CYSTOSCOPY WITH STENT PLACEMENT;  Surgeon: Bjorn Pippin, MD;  Location: [facility] ORS;  Service: Urology;  Laterality: Left;  . CYSTOSCOPY/URETEROSCOPY/HOLMIUM LASER/STENT PLACEMENT Left 10/02/2016  . TUBAL LIGATION    . VAGINAL DELIVERY     x 4    Home Medications:  Allergies as of 06/04/2017      Reactions   Augmentin [amoxicillin-pot Clavulanate] Rash      Medication List       Accurate as of 06/04/17 11:59 PM. Always use your most recent med list.          acetaminophen 325 MG tablet Commonly known as:  TYLENOL Take 650 mg by mouth every 6 (six) hours as needed.   multivitamin tablet Take 1 tablet by mouth daily.       Allergies:  Allergies  Allergen Reactions  . Augmentin [Amoxicillin-Pot Clavulanate] Rash    Family History: Family History  Problem Relation Age of Onset  . Cholecystitis Mother   . Nephrolithiasis Neg Hx   . Migraines Neg Hx     Social History:  reports that she has been smoking.  She has never used smokeless tobacco. She reports that she does not drink alcohol or use drugs.  ROS: UROLOGY Frequent Urination?: Yes Hard to postpone urination?: No Burning/pain with urination?: No  Get up at night to urinate?: Yes Leakage of urine?: Yes Urine stream starts and stops?: No Trouble starting stream?: No Do you have to strain to urinate?: No Blood in urine?: No Urinary tract infection?: No Sexually transmitted disease?: No Injury to kidneys or bladder?: No Painful intercourse?: No Weak stream?: No Currently pregnant?: No Vaginal bleeding?: No Last menstrual period?: n  Gastrointestinal Nausea?:  No Vomiting?: No Indigestion/heartburn?: No Diarrhea?: No Constipation?: No  Constitutional Fever: No Night sweats?: No Weight loss?: No Fatigue?: No  Skin Skin rash/lesions?: No Itching?: No  Eyes Blurred vision?: No Double vision?: No  Ears/Nose/Throat Sore throat?: No Sinus problems?: No  Hematologic/Lymphatic Swollen glands?: No Easy bruising?: No  Cardiovascular Leg swelling?: No Chest pain?: No  Respiratory Cough?: No Shortness of breath?: No  Endocrine Excessive thirst?: No  Musculoskeletal Back pain?: No Joint pain?: No  Neurological Headaches?: No Dizziness?: No  Psychologic Depression?: No Anxiety?: No  Physical Exam: BP 130/76   Pulse (!) 103   Ht 5\' 8"  (1.727 m)   Wt 210 lb (95.3 kg)   BMI 31.93 kg/m   Constitutional:  Alert and oriented, No acute distress. HEENT: [place] AT, moist mucus membranes.  Trachea midline, no masses. Cardiovascular: No clubbing, cyanosis, or edema. Respiratory: Normal respiratory effort, no increased work of breathing. GI: Abdomen is soft, nontender, nondistended, no abdominal masses GU: No CVA tenderness.  Skin: No rashes, bruises or suspicious lesions. Neurologic: Grossly intact, no focal deficits, moving all 4 extremities. Psychiatric: Normal mood and affect.  Laboratory Data: Lab Results  Component Value Date   WBC 12.1 (H) 05/01/2017   HGB 13.0 05/01/2017   HCT 38.0 05/01/2017   MCV 90.2 05/01/2017   PLT 203 05/01/2017    Lab Results  Component Value Date   CREATININE 0.75 05/01/2017    Urinalysis Results for orders placed or performed in visit on 05/12/17  CULTURE, URINE COMPREHENSIVE  Result Value Ref Range   Urine Culture, Comprehensive Final report    Organism ID, Bacteria Comment   Microscopic Examination  Result Value Ref Range   WBC, UA None seen 0 - 5 /hpf   RBC, UA >30 (H) 0 - 2 /hpf   Epithelial Cells (non renal) 0-10 0 - 10 /hpf   Bacteria, UA Few (A) None seen/Few   Urinalysis, Complete  Result Value Ref Range   Specific Gravity, UA 1.015 1.005 - 1.030   pH, UA 6.5 5.0 - 7.5   Color, UA Red (A) Yellow   Appearance Ur Cloudy (A) Clear   Leukocytes, UA 1+ (A) Negative   Protein, UA 2+ (A) Negative/Trace   Glucose, UA Negative Negative   Ketones, UA Negative Negative   RBC, UA 3+ (A) Negative   Bilirubin, UA Negative Negative   Urobilinogen, Ur 0.2 0.2 - 1.0 mg/dL   Nitrite, UA Negative Negative   Microscopic Examination See below:      Pertinent Imaging: CLINICAL DATA:  LEFT renal atrophy and severe LEFT hydroureteronephrosis, post LEFT ureteral stenting  EXAM: NUCLEAR MEDICINE RENAL SCAN WITH DIURETIC ADMINISTRATION  TECHNIQUE: Radionuclide angiographic and sequential renal images were obtained after intravenous injection of radiopharmaceutical. Imaging was continued during slow intravenous injection of Lasix approximately 15 minutes after the start of the examination.  RADIOPHARMACEUTICALS:  5.21 mCi Technetium-4952m MAG3 IV  Pharmaceutical: Lasix 20 mg IV 20 minutes into exam  COMPARISON:  CT abdomen pelvis 05/01/2017  FINDINGS: Flow:  Slightly diminished blood flow to LEFT kidney versus RIGHT.  Left renogram: Mildly  delayed uptake, concentration, and excretion of tracer. Dilated LEFT renal collecting system. Retention of tracer within the markedly dilated distal LEFT ureter. However, the good drainage of tracer from the LEFT renal collecting system and LEFT ureter is identified following Lasix administration. Delayed maximum activity at approximately 12.7 minutes. Normal fall to half maximum tracer activity following Lasix.  Right renogram: Normal uptake, concentration and excretion of tracer by RIGHT kidney. Normal time to maximum activity of approximately 4.8 minutes. Normal washout of tracer from the RIGHT kidney both before and after Lasix administration.  Differential:  Left kidney = 47 %  Right kidney =  53 %  T1/2 post Lasix :  Left kidney = 27.6 min  Right kidney = 26.3 min  IMPRESSION: Normal RIGHT renogram.  Retained contrast in dilated LEFT renal collecting system and dilated distal LEFT ureter as seen on CT.  Normal washout of tracer from both kidneys following diuretic administration indicating no significant urinary outflow obstruction.   Electronically Signed   By: Ulyses Southward M.D.   On: 05/29/2017 15:39  Lasix renogram personally reviewed today and with the patient.  Assessment & Plan:    1. Left nephrolithiasis Nonobstructing  left renal calculi up to 8 mm Recommend URS at the time of dx uretersocopy  2. Left renal atrophy with severe hydroureteronephrosis secondary to ureteral stricture Suspect left distal ureteral stricture 2/2 damage from chronically obstructing stone which has now been addressed Stent in place Excellent residual function of the left kidney despite chronic obstruction I recommended returning to the operating room for diagnostic purposes for ureteroscopy and laser lithotripsy to treat nonobstructing stones, and stent exchange. Risk of this procedure were discussed in detail. We also discussed today she will ultimately likely need a ureteral reimplant. We also discussed pyeloplasty today her she is more attention reimplant. We'll discuss this further following diagnostic ureteroscopy and treatment of her nonobstructing stones.  3. Bladder spasm Continue Flomax and Ditropan for bladder spasms  Schedule surgery as above  Vanna Scotland, MD    [facility] [address] [place], [place] [address] [phone]

## 2017-06-05 NOTE — Telephone Encounter (Signed)
Notified pt of surgery scheduled with Dr Apolinar JunesBrandon on 06/15/17, pre-admit testing phone interview on 06/11/17 between 1-5pm & to call Friday prior to surgery for arrival time to [facility]. Pt has no questions at this time & voices understanding.

## 2017-06-11 ENCOUNTER — Encounter
Admission: RE | Admit: 2017-06-11 | Discharge: 2017-06-11 | Disposition: A | Discharge status: Home / Self Care | Payer: Medicaid Other | Source: Ambulatory Visit | Attending: Urology | Admitting: Urology

## 2017-06-11 NOTE — Patient Instructions (Addendum)
  Your procedure is scheduled on: 06-15-17  Report to Same Day Surgery 2nd floor medical mall [facility](Medical Mall Entrance-take elevator on left to 2nd floor.  Check in with surgery information desk.) To find out your arrival time please call [phone] between 1PM - 3PM on 06-12-17  Remember: Instructions that are not followed completely may result in serious medical risk, up to and including death, or upon the discretion of your surgeon and anesthesiologist your surgery may need to be rescheduled.    _x___ 1. Do not eat food or drink liquids after midnight. No gum chewing or hard candies.     __x__ 2. No Alcohol for 24 hours before or after surgery.   __x__3. No Smoking for 24 prior to surgery.   ____  4. Bring all medications with you on the day of surgery if instructed.    __x__ 5. Notify your doctor if there is any change in your medical condition     (cold, fever, infections).     Do not wear jewelry, make-up, hairpins, clips or nail polish.  Do not wear lotions, powders, or perfumes. You may wear deodorant.  Do not shave 48 hours prior to surgery. Men may shave face and neck.  Do not bring valuables to the hospital.    [facility] is not responsible for any belongings or valuables.               Contacts, dentures or bridgework may not be worn into surgery.  Leave your suitcase in the car. After surgery it may be brought to your room.  For patients admitted to the hospital, discharge time is determined by your  treatment team.   Patients discharged the day of surgery will not be allowed to drive home.  You will need someone to drive you home and stay with you the night of your procedure.    Please read over the following fact sheets that you were given:     _x___ Take anti-hypertensive (unless it includes a diuretic), cardiac, seizure, asthma,     anti-reflux and psychiatric medicines. These include:  1. None  2.  3.  4.  5.  6.  ____Fleets enema or Magnesium Citrate as  directed.   ____ Use CHG Soap or sage wipes as directed on instruction sheet   ____ Use inhalers on the day of surgery and bring to hospital day of surgery  ____ Stop Metformin and Janumet 2 days prior to surgery.    ____ Take 1/2 of usual insulin dose the night before surgery and none on the morning     surgery.   ____ Follow recommendations from Cardiologist, Pulmonologist or PCP regarding  stopping Aspirin, Coumadin, Pllavix ,Eliquis, Effient, or Pradaxa, and Pletal.  X____Stop Anti-inflammatories such as Advil, Aleve, Ibuprofen, Motrin, Naproxen, Naprosyn, Goodies powders or aspirin products NOW-ok to take Tylenol   ____ Stop supplements until after surgery.     ____ Bring C-Pap to the hospital.

## 2017-06-11 NOTE — Patient Instructions (Signed)
  Your procedure is scheduled on: 06-15-17 Report to Same Day Surgery 2nd floor medical mall [facility](Medical Mall Entrance-take elevator on left to 2nd floor.  Check in with surgery information desk.) To find out your arrival time please call [phone] between 1PM - 3PM on 06-12-17  Remember: Instructions that are not followed completely may result in serious medical risk, up to and including death, or upon the discretion of your surgeon and anesthesiologist your surgery may need to be rescheduled.    _x___ 1. Do not eat food or drink liquids after midnight. No gum chewing or   hard candies.     __x__ 2. No Alcohol for 24 hours before or after surgery.   __x__3. No Smoking for 24 prior to surgery.   ____  4. Bring all medications with you on the day of surgery if instructed.    __x__ 5. Notify your doctor if there is any change in your medical condition     (cold, fever, infections).     Do not wear jewelry, make-up, hairpins, clips or nail polish.  Do not wear lotions, powders, or perfumes. You may wear deodorant.  Do not shave 48 hours prior to surgery. Men may shave face and neck.  Do not bring valuables to the hospital.    [facility] is not responsible for any belongings or valuables.               Contacts, dentures or bridgework may not be worn into surgery.  Leave your suitcase in the car. After surgery it may be brought to your room.  For patients admitted to the hospital, discharge time is determined by your                       treatment team.   Patients discharged the day of surgery will not be allowed to drive home.  You will need someone to drive you home and stay with you the night of your procedure.    Please read over the following fact sheets that you were given:   [facility] Preparing for Surgery and or MRSA Information   ____ Take anti-hypertensive (unless it includes a diuretic), cardiac, seizure, asthma,     anti-reflux and psychiatric medicines. These include:  1.  none  2.  3.  4.  5.  6.  ____Fleets enema or Magnesium Citrate as directed.   ____ Use CHG Soap or sage wipes as directed on instruction sheet   _X___ Use inhalers on the day of surgery and bring to hospital day of surgery-USE ALBUTEROL INHALER AT HOME AND BRING TO HOSPITAL  ____ Stop Metformin and Janumet 2 days prior to surgery.    ____ Take 1/2 of usual insulin dose the night before surgery and none on the morning     surgery.   ____ Follow recommendations from Cardiologist, Pulmonologist or PCP regarding          stopping Aspirin, Coumadin, Pllavix ,Eliquis, Effient, or Pradaxa, and Pletal.  X____Stop Anti-inflammatories such as Advil, Aleve, Ibuprofen, Motrin, Naproxen, Naprosyn, Goodies powders or aspirin products. NOW-OK to take Tylenol    ____ Stop supplements until after surgery.  But may continue Vitamin D, Vitamin B,       and multivitamin.   ____ Bring C-Pap to the hospital.

## 2017-06-14 MED ORDER — CIPROFLOXACIN IN D5W 400 MG/200ML IV SOLN
400.0000 mg | INTRAVENOUS | Status: AC
  Administered 2017-07-06: 400 mg via INTRAVENOUS

## 2017-06-17 ENCOUNTER — Encounter: Payer: Self-pay | Admitting: Urology

## 2017-06-17 ENCOUNTER — Ambulatory Visit (INDEPENDENT_AMBULATORY_CARE_PROVIDER_SITE_OTHER): Payer: BLUE CROSS/BLUE SHIELD

## 2017-06-17 VITALS — BP 112/78 | HR 104 | Ht 68.0 in | Wt 209.8 lb

## 2017-06-17 DIAGNOSIS — A599 Trichomoniasis, unspecified: Secondary | ICD-10-CM

## 2017-06-17 DIAGNOSIS — R300 Dysuria: Secondary | ICD-10-CM

## 2017-06-17 LAB — URINALYSIS, COMPLETE
Bilirubin, UA: NEGATIVE
GLUCOSE, UA: NEGATIVE
KETONES UA: NEGATIVE
NITRITE UA: NEGATIVE
UUROB: 0.2 mg/dL (ref 0.2–1.0)
pH, UA: 5.5 (ref 5.0–7.5)

## 2017-06-17 LAB — MICROSCOPIC EXAMINATION

## 2017-06-17 MED ORDER — METRONIDAZOLE 500 MG PO TABS: 500.0000 mg | ORAL_TABLET | Freq: Once | ORAL | 0 refills | Status: AC

## 2017-06-17 NOTE — Progress Notes (Signed)
Per Dr. Apolinar JunesBrandon patient was notified that UA today was positive for Trichomonas which is a STD and she would need treatment. Flagyl 500mg  4 tablets once was sent into her pharm, she was instructed not to drink while taking this medication.  She was instructed to notify her sexual partners so they can get tested and treated and follow up with PCP

## 2017-06-17 NOTE — Progress Notes (Signed)
Pt present in the office today with c/o UTI sx(s) x2 days:  Urinary frequency Hard to postpone urination Burning/painful urination Leakage of urine Blood in urine Back/flank pain Lower abdominal pain Nausea Urinary urgency   Vitals:   06/17/17 1121  BP: 112/78  Pulse: (!) 104  Weight: 209 lb 12.8 oz (95.2 kg)  Height: 5\' 8"  (1.727 m)     Pt has not been on antibiotics in the last 30 days. Pt is not on suppressive antibiotics. Pt has not had Urological surgery in the last 30 days.  Pt provided urine sample, advised pt on u/a and ucx turn-around times, and informed will be contacted when results reviewed by provider. Pt verbalized understanding.

## 2017-06-19 LAB — URINE CULTURE

## 2017-06-23 ENCOUNTER — Ambulatory Visit: Payer: Medicaid Other | Admitting: Urology

## 2017-06-26 ENCOUNTER — Other Ambulatory Visit: Payer: BLUE CROSS/BLUE SHIELD

## 2017-06-29 ENCOUNTER — Telehealth: Payer: Self-pay | Admitting: Urology

## 2017-06-29 ENCOUNTER — Other Ambulatory Visit: Payer: BLUE CROSS/BLUE SHIELD

## 2017-06-29 NOTE — Telephone Encounter (Signed)
Patient called and cancelled her UA app. She said she was stuck at another app.    Kathleen Hutchinson

## 2017-07-01 ENCOUNTER — Other Ambulatory Visit: Payer: Self-pay | Admitting: Family Medicine

## 2017-07-01 ENCOUNTER — Other Ambulatory Visit: Payer: BLUE CROSS/BLUE SHIELD

## 2017-07-01 DIAGNOSIS — N261 Atrophy of kidney (terminal): Secondary | ICD-10-CM

## 2017-07-01 DIAGNOSIS — N200 Calculus of kidney: Secondary | ICD-10-CM

## 2017-07-05 LAB — CULTURE, URINE COMPREHENSIVE

## 2017-07-06 ENCOUNTER — Encounter
Admission: RE | Disposition: A | Discharge status: Home / Self Care | Payer: Self-pay | Source: Ambulatory Visit | Attending: Urology

## 2017-07-06 ENCOUNTER — Ambulatory Visit: Payer: Self-pay | Admitting: Certified Registered Nurse Anesthetist

## 2017-07-06 ENCOUNTER — Ambulatory Visit
Admission: RE | Admit: 2017-07-06 | Discharge: 2017-07-06 | Disposition: A | Discharge status: Home / Self Care | Payer: Self-pay | Source: Ambulatory Visit | Attending: Urology | Admitting: Urology

## 2017-07-06 ENCOUNTER — Encounter: Payer: Self-pay | Admitting: *Deleted

## 2017-07-06 DIAGNOSIS — N200 Calculus of kidney: Secondary | ICD-10-CM

## 2017-07-06 DIAGNOSIS — N132 Hydronephrosis with renal and ureteral calculous obstruction: Secondary | ICD-10-CM | POA: Insufficient documentation

## 2017-07-06 DIAGNOSIS — F17200 Nicotine dependence, unspecified, uncomplicated: Secondary | ICD-10-CM | POA: Insufficient documentation

## 2017-07-06 DIAGNOSIS — N135 Crossing vessel and stricture of ureter without hydronephrosis: Secondary | ICD-10-CM

## 2017-07-06 DIAGNOSIS — Z880 Allergy status to penicillin: Secondary | ICD-10-CM | POA: Insufficient documentation

## 2017-07-06 HISTORY — PX: URETEROSCOPY WITH HOLMIUM LASER LITHOTRIPSY: SHX6645

## 2017-07-06 HISTORY — PX: CYSTOSCOPY W/ RETROGRADES: SHX1426

## 2017-07-06 HISTORY — PX: CYSTOSCOPY W/ URETERAL STENT PLACEMENT: SHX1429

## 2017-07-06 LAB — URINE DRUG SCREEN, QUALITATIVE (ARMC ONLY)
Amphetamines, Ur Screen: NOT DETECTED
Barbiturates, Ur Screen: NOT DETECTED
Benzodiazepine, Ur Scrn: NOT DETECTED
Cannabinoid 50 Ng, Ur ~~LOC~~: POSITIVE — AB
Cocaine Metabolite,Ur ~~LOC~~: NOT DETECTED
MDMA (Ecstasy)Ur Screen: NOT DETECTED
Methadone Scn, Ur: NOT DETECTED
Opiate, Ur Screen: NOT DETECTED
Phencyclidine (PCP) Ur S: NOT DETECTED
Tricyclic, Ur Screen: NOT DETECTED

## 2017-07-06 LAB — POCT PREGNANCY, URINE: Preg Test, Ur: NEGATIVE

## 2017-07-06 SURGERY — CYSTOSCOPY, FLEXIBLE, WITH STENT REPLACEMENT
Anesthesia: General | Laterality: Left

## 2017-07-06 MED ORDER — ROCURONIUM BROMIDE 100 MG/10ML IV SOLN
INTRAVENOUS | Status: DC | PRN
  Administered 2017-07-06: 20 mg via INTRAVENOUS

## 2017-07-06 MED ORDER — MIDAZOLAM HCL 2 MG/2ML IJ SOLN
INTRAMUSCULAR | Status: AC
  Filled 2017-07-06: qty 2

## 2017-07-06 MED ORDER — GLYCOPYRROLATE 0.2 MG/ML IJ SOLN
INTRAMUSCULAR | Status: AC
  Filled 2017-07-06: qty 1

## 2017-07-06 MED ORDER — FENTANYL CITRATE (PF) 100 MCG/2ML IJ SOLN
INTRAMUSCULAR | Status: AC
  Filled 2017-07-06: qty 2

## 2017-07-06 MED ORDER — PHENYLEPHRINE HCL 10 MG/ML IJ SOLN
INTRAMUSCULAR | Status: AC
  Filled 2017-07-06: qty 1

## 2017-07-06 MED ORDER — DEXAMETHASONE SODIUM PHOSPHATE 10 MG/ML IJ SOLN
INTRAMUSCULAR | Status: DC | PRN
  Administered 2017-07-06: 10 mg via INTRAVENOUS

## 2017-07-06 MED ORDER — SUCCINYLCHOLINE CHLORIDE 20 MG/ML IJ SOLN
INTRAMUSCULAR | Status: AC
  Filled 2017-07-06: qty 1

## 2017-07-06 MED ORDER — PROPOFOL 10 MG/ML IV BOLUS
INTRAVENOUS | Status: DC | PRN
  Administered 2017-07-06: 160 mg via INTRAVENOUS

## 2017-07-06 MED ORDER — SUGAMMADEX SODIUM 200 MG/2ML IV SOLN
INTRAVENOUS | Status: DC | PRN
  Administered 2017-07-06: 186 mg via INTRAVENOUS

## 2017-07-06 MED ORDER — FENTANYL CITRATE (PF) 100 MCG/2ML IJ SOLN
INTRAMUSCULAR | Status: DC | PRN
  Administered 2017-07-06 (×2): 50 ug via INTRAVENOUS

## 2017-07-06 MED ORDER — ONDANSETRON HCL 4 MG/2ML IJ SOLN: 4.0000 mg | Freq: Once | INTRAMUSCULAR | Status: DC | PRN

## 2017-07-06 MED ORDER — FENTANYL CITRATE (PF) 100 MCG/2ML IJ SOLN: 25.0000 ug | INTRAMUSCULAR | Status: DC | PRN

## 2017-07-06 MED ORDER — HYDROCODONE-ACETAMINOPHEN 5-325 MG PO TABS: 1.0000 | ORAL_TABLET | Freq: Four times a day (QID) | ORAL | 0 refills | Status: DC | PRN

## 2017-07-06 MED ORDER — EPHEDRINE SULFATE 50 MG/ML IJ SOLN
INTRAMUSCULAR | Status: AC
  Filled 2017-07-06: qty 1

## 2017-07-06 MED ORDER — PROPOFOL 10 MG/ML IV BOLUS
INTRAVENOUS | Status: AC
  Filled 2017-07-06: qty 20

## 2017-07-06 MED ORDER — ROCURONIUM BROMIDE 50 MG/5ML IV SOLN
INTRAVENOUS | Status: AC
  Filled 2017-07-06: qty 1

## 2017-07-06 MED ORDER — CIPROFLOXACIN IN D5W 400 MG/200ML IV SOLN
INTRAVENOUS | Status: AC
  Filled 2017-07-06: qty 200

## 2017-07-06 MED ORDER — FAMOTIDINE 20 MG PO TABS
20.0000 mg | ORAL_TABLET | Freq: Once | ORAL | Status: AC
  Administered 2017-07-06: 20 mg via ORAL

## 2017-07-06 MED ORDER — LIDOCAINE HCL (CARDIAC) 20 MG/ML IV SOLN
INTRAVENOUS | Status: DC | PRN
  Administered 2017-07-06: 100 mg via INTRAVENOUS

## 2017-07-06 MED ORDER — FAMOTIDINE 20 MG PO TABS
ORAL_TABLET | ORAL | Status: AC
  Filled 2017-07-06: qty 1

## 2017-07-06 MED ORDER — KETOROLAC TROMETHAMINE 30 MG/ML IJ SOLN
INTRAMUSCULAR | Status: DC | PRN
  Administered 2017-07-06: 30 mg via INTRAVENOUS

## 2017-07-06 MED ORDER — LIDOCAINE HCL (PF) 2 % IJ SOLN
INTRAMUSCULAR | Status: AC
  Filled 2017-07-06: qty 2

## 2017-07-06 MED ORDER — LACTATED RINGERS IV SOLN
INTRAVENOUS | Status: DC
  Administered 2017-07-06: 07:00:00 via INTRAVENOUS

## 2017-07-06 MED ORDER — ONDANSETRON HCL 4 MG/2ML IJ SOLN
INTRAMUSCULAR | Status: DC | PRN
  Administered 2017-07-06: 4 mg via INTRAVENOUS

## 2017-07-06 MED ORDER — SUCCINYLCHOLINE CHLORIDE 20 MG/ML IJ SOLN
INTRAMUSCULAR | Status: DC | PRN
  Administered 2017-07-06: 100 mg via INTRAVENOUS

## 2017-07-06 MED ORDER — MIDAZOLAM HCL 2 MG/2ML IJ SOLN
INTRAMUSCULAR | Status: DC | PRN
  Administered 2017-07-06: 2 mg via INTRAVENOUS

## 2017-07-06 MED ORDER — IOTHALAMATE MEGLUMINE 43 % IV SOLN
INTRAVENOUS | Status: DC | PRN
  Administered 2017-07-06: 50 mL

## 2017-07-06 SURGICAL SUPPLY — 29 items
BAG DRAIN CYSTO-URO LG1000N (MISCELLANEOUS) ×2 IMPLANT
BASKET ZERO TIP 1.9FR (BASKET) IMPLANT
CATH URETL 5X70 OPEN END (CATHETERS) ×2 IMPLANT
CNTNR SPEC 2.5X3XGRAD LEK (MISCELLANEOUS)
CONRAY 43 FOR UROLOGY 50M (MISCELLANEOUS) ×2 IMPLANT
CONT SPEC 4OZ STER OR WHT (MISCELLANEOUS)
CONTAINER SPEC 2.5X3XGRAD LEK (MISCELLANEOUS) IMPLANT
DRAPE UTILITY 15X26 TOWEL STRL (DRAPES) ×2 IMPLANT
FIBER LASER LITHO 273 (Laser) ×2 IMPLANT
GLOVE BIO SURGEON STRL SZ 6.5 (GLOVE) ×4 IMPLANT
GOWN STRL REUS W/ TWL LRG LVL3 (GOWN DISPOSABLE) ×2 IMPLANT
GOWN STRL REUS W/TWL LRG LVL3 (GOWN DISPOSABLE) ×2
GUIDEWIRE GREEN .038 145CM (MISCELLANEOUS) ×2 IMPLANT
INFUSOR MANOMETER BAG 3000ML (MISCELLANEOUS) ×2 IMPLANT
INTRODUCER DILATOR DOUBLE (INTRODUCER) ×2 IMPLANT
KIT RM TURNOVER CYSTO AR (KITS) ×2 IMPLANT
PACK CYSTO AR (MISCELLANEOUS) ×2 IMPLANT
SCRUB POVIDONE IODINE 4 OZ (MISCELLANEOUS) IMPLANT
SENSORWIRE 0.038 NOT ANGLED (WIRE) ×2
SET CYSTO W/LG BORE CLAMP LF (SET/KITS/TRAYS/PACK) ×2 IMPLANT
SHEATH URETERAL 12FRX35CM (MISCELLANEOUS) IMPLANT
SOL .9 NS 3000ML IRR  AL (IV SOLUTION) ×1
SOL .9 NS 3000ML IRR UROMATIC (IV SOLUTION) ×1 IMPLANT
STENT URET 6FRX24 CONTOUR (STENTS) ×2 IMPLANT
STENT URET 6FRX26 CONTOUR (STENTS) IMPLANT
SURGILUBE 2OZ TUBE FLIPTOP (MISCELLANEOUS) ×2 IMPLANT
SYRINGE IRR TOOMEY STRL 70CC (SYRINGE) ×2 IMPLANT
WATER STERILE IRR 1000ML POUR (IV SOLUTION) ×2 IMPLANT
WIRE SENSOR 0.038 NOT ANGLED (WIRE) ×1 IMPLANT

## 2017-07-06 NOTE — H&P (Signed)
06/04/2017  --> updated 07/06/17 without change.  RRR. CTAB.  UCx negative.    Kathleen Hutchinson 08-26-1986 [id number]  Referring provider: No referring provider defined for this encounter.      Chief Complaint  Patient presents with  . Results    NM study results    HPI: 31 year old female Who returns to the office today to follow-up Lasix renogram for further diagnostic evaluation of severe left hydronephrosis, left ureteral stricture and atrophy.  MAG3 Lasix renogram shows excellent function of the left kidney, 47% on the left versus 53% of the right.  There was very mild delayed uptake concentration, and excretion of the radiotracer as well as an appreciable dilated left collecting system.  She does have the usual stent irritation but otherwise has no complaints today.  Previous history She presented to the emergency room on 05/01/2017 with left flank pain 3 days, dysuria, urgency and frequency.  On CT scan, she was found to have severe hydroureteronephrosis down to the level of the UVJ without apparent stone. She also had evidence of urinary tract infection with a positive urinalysis and ultimately grew Escherichia coli resistant to ampicillin and Zosyn.  She was taken urgently to the OR for left ureteral stent placement. She improved clinically and was discharged with urologic outpatient follow-up.  She has a personal history of nephrolithiasis and underwent ureteroscopy in October 2017 in [place]. She is since moved her first social reasons.  Prior to above, she did have an obstructing distal ureteral stone back in 2015. She was scheduled for ureteroscopy with Dr. Toribio HarbourUberoi in [place] but on the day of the procedure, she left the preoperative holding area refused to have surgery. She returned 2 years later and ultimately underwent ureteroscopy. Stent remained in place for 2 weeks and was subsequently removed. She never followed up thereafter.    PMH:     Past  Medical History:  Diagnosis Date  . Headaches, cluster   . Nephrolithiasis     Surgical History:      Past Surgical History:  Procedure Laterality Date  . CESAREAN SECTION    . CYSTOSCOPY W/ RETROGRADES Left 05/01/2017   Procedure: CYSTOSCOPY WITH RETROGRADE PYELOGRAM;  Surgeon: Bjorn PippinWrenn, John, MD;  Location: [facility] ORS;  Service: Urology;  Laterality: Left;  . CYSTOSCOPY WITH STENT PLACEMENT Left 05/01/2017   Procedure: CYSTOSCOPY WITH STENT PLACEMENT;  Surgeon: Bjorn PippinWrenn, John, MD;  Location: [facility] ORS;  Service: Urology;  Laterality: Left;  . CYSTOSCOPY/URETEROSCOPY/HOLMIUM LASER/STENT PLACEMENT Left 10/02/2016  . TUBAL LIGATION    . VAGINAL DELIVERY     x 4    Home Medications:      Allergies as of 06/04/2017      Reactions   Augmentin [amoxicillin-pot Clavulanate] Rash               Medication List           Accurate as of 06/04/17 11:59 PM. Always use your most recent med list.           acetaminophen 325 MG tablet Commonly known as:  TYLENOL Take 650 mg by mouth every 6 (six) hours as needed.   multivitamin tablet Take 1 tablet by mouth daily.       Allergies:      Allergies  Allergen Reactions  . Augmentin [Amoxicillin-Pot Clavulanate] Rash    Family History:      Family History  Problem Relation Age of Onset  . Cholecystitis Mother   . Nephrolithiasis Neg Hx   .  Migraines Neg Hx     Social History:  reports that she has been smoking.  She has never used smokeless tobacco. She reports that she does not drink alcohol or use drugs.  ROS: UROLOGY Frequent Urination?: Yes Hard to postpone urination?: No Burning/pain with urination?: No Get up at night to urinate?: Yes Leakage of urine?: Yes Urine stream starts and stops?: No Trouble starting stream?: No Do you have to strain to urinate?: No Blood in urine?: No Urinary tract infection?: No Sexually transmitted disease?: No Injury to kidneys or bladder?:  No Painful intercourse?: No Weak stream?: No Currently pregnant?: No Vaginal bleeding?: No Last menstrual period?: n  Gastrointestinal Nausea?: No Vomiting?: No Indigestion/heartburn?: No Diarrhea?: No Constipation?: No  Constitutional Fever: No Night sweats?: No Weight loss?: No Fatigue?: No  Skin Skin rash/lesions?: No Itching?: No  Eyes Blurred vision?: No Double vision?: No  Ears/Nose/Throat Sore throat?: No Sinus problems?: No  Hematologic/Lymphatic Swollen glands?: No Easy bruising?: No  Cardiovascular Leg swelling?: No Chest pain?: No  Respiratory Cough?: No Shortness of breath?: No  Endocrine Excessive thirst?: No  Musculoskeletal Back pain?: No Joint pain?: No  Neurological Headaches?: No Dizziness?: No  Psychologic Depression?: No Anxiety?: No  Physical Exam: BP 130/76   Pulse (!) 103   Ht 5\' 8"  (1.727 m)   Wt 210 lb (95.3 kg)   BMI 31.93 kg/m   Constitutional:  Alert and oriented, No acute distress. HEENT: [place] AT, moist mucus membranes.  Trachea midline, no masses. Cardiovascular: No clubbing, cyanosis, or edema. Respiratory: Normal respiratory effort, no increased work of breathing. GI: Abdomen is soft, nontender, nondistended, no abdominal masses GU: No CVA tenderness.  Skin: No rashes, bruises or suspicious lesions. Neurologic: Grossly intact, no focal deficits, moving all 4 extremities. Psychiatric: Normal mood and affect.  Laboratory Data: RecentLabs       Lab Results  Component Value Date   WBC 12.1 (H) 05/01/2017   HGB 13.0 05/01/2017   HCT 38.0 05/01/2017   MCV 90.2 05/01/2017   PLT 203 05/01/2017      RecentLabs       Lab Results  Component Value Date   CREATININE 0.75 05/01/2017      Urinalysis      Results for orders placed or performed in visit on 05/12/17  CULTURE, URINE COMPREHENSIVE  Result Value Ref Range   Urine Culture, Comprehensive Final report    Organism  ID, Bacteria Comment   Microscopic Examination  Result Value Ref Range   WBC, UA None seen 0 - 5 /hpf   RBC, UA >30 (H) 0 - 2 /hpf   Epithelial Cells (non renal) 0-10 0 - 10 /hpf   Bacteria, UA Few (A) None seen/Few  Urinalysis, Complete  Result Value Ref Range   Specific Gravity, UA 1.015 1.005 - 1.030   pH, UA 6.5 5.0 - 7.5   Color, UA Red (A) Yellow   Appearance Ur Cloudy (A) Clear   Leukocytes, UA 1+ (A) Negative   Protein, UA 2+ (A) Negative/Trace   Glucose, UA Negative Negative   Ketones, UA Negative Negative   RBC, UA 3+ (A) Negative   Bilirubin, UA Negative Negative   Urobilinogen, Ur 0.2 0.2 - 1.0 mg/dL   Nitrite, UA Negative Negative   Microscopic Examination See below:      Pertinent Imaging: CLINICAL DATA: LEFT renal atrophy and severe LEFT hydroureteronephrosis, post LEFT ureteral stenting  EXAM: NUCLEAR MEDICINE RENAL SCAN WITH DIURETIC ADMINISTRATION  TECHNIQUE: Radionuclide angiographic  and sequential renal images were obtained after intravenous injection of radiopharmaceutical. Imaging was continued during slow intravenous injection of Lasix approximately 15 minutes after the start of the examination.  RADIOPHARMACEUTICALS: 5.21 mCi Technetium-10m MAG3 IV  Pharmaceutical: Lasix 20 mg IV 20 minutes into exam  COMPARISON: CT abdomen pelvis 05/01/2017  FINDINGS: Flow: Slightly diminished blood flow to LEFT kidney versus RIGHT.  Left renogram: Mildly delayed uptake, concentration, and excretion of tracer. Dilated LEFT renal collecting system. Retention of tracer within the markedly dilated distal LEFT ureter. However, the good drainage of tracer from the LEFT renal collecting system and LEFT ureter is identified following Lasix administration. Delayed maximum activity at approximately 12.7 minutes. Normal fall to half maximum tracer activity following Lasix.  Right renogram: Normal uptake, concentration and  excretion of tracer by RIGHT kidney. Normal time to maximum activity of approximately 4.8 minutes. Normal washout of tracer from the RIGHT kidney both before and after Lasix administration.  Differential:  Left kidney = 47 %  Right kidney = 53 %  T1/2 post Lasix :  Left kidney = 27.6 min  Right kidney = 26.3 min  IMPRESSION: Normal RIGHT renogram.  Retained contrast in dilated LEFT renal collecting system and dilated distal LEFT ureter as seen on CT.  Normal washout of tracer from both kidneys following diuretic administration indicating no significant urinary outflow obstruction.   Electronically Signed By: Ulyses Southward M.D. On: 05/29/2017 15:39  Lasix renogram personally reviewed today and with the patient.  Assessment & Plan:    1. Left nephrolithiasis Nonobstructing  left renal calculi up to 8 mm Recommend URS at the time of dx uretersocopy  2. Left renal atrophy with severe hydroureteronephrosis secondary to ureteral stricture Suspect left distal ureteral stricture 2/2 damage from chronically obstructing stone which has now been addressed Stent in place Excellent residual function of the left kidney despite chronic obstruction I recommended returning to the operating room for diagnostic purposes for ureteroscopy and laser lithotripsy to treat nonobstructing stones, and stent exchange. Risk of this procedure were discussed in detail. We also discussed today she will ultimately likely need a ureteral reimplant. We also discussed pyeloplasty today her she is more attention reimplant. We'll discuss this further following diagnostic ureteroscopy and treatment of her nonobstructing stones.  3. Bladder spasm Continue Flomax and Ditropan for bladder spasms  Schedule surgery as above  Vanna Scotland, MD    [facility] [address] [place], [place] [address] [phone]     Electronically signed by  Vanna Scotland, MD at 06/05/2017 8:38 AM

## 2017-07-06 NOTE — Anesthesia Procedure Notes (Signed)
Procedure Name: Intubation Performed by: Malva CoganBEANE, Zhane Bluitt Pre-anesthesia Checklist: Patient identified, Patient being monitored, Timeout performed, Emergency Drugs available and Suction available Patient Re-evaluated:Patient Re-evaluated prior to induction Oxygen Delivery Method: Circle system utilized Preoxygenation: Pre-oxygenation with 100% oxygen Induction Type: IV induction and Cricoid Pressure applied Ventilation: Mask ventilation without difficulty Laryngoscope Size: 3 and Glidescope Grade View: Grade I Tube type: Oral Tube size: 7.0 mm Number of attempts: 1 Airway Equipment and Method: Video-laryngoscopy and Rigid stylet Placement Confirmation: ETT inserted through vocal cords under direct vision,  positive ETCO2 and breath sounds checked- equal and bilateral Secured at: 21 cm Tube secured with: Tape Dental Injury: Teeth and Oropharynx as per pre-operative assessment

## 2017-07-06 NOTE — Op Note (Signed)
Date of procedure: 07/06/17  Preoperative diagnosis:  1. Severe left hydroureteronephrosis 2. Left distal ureteral stricture 3. Left nonobstructing renal calculi   Postoperative diagnosis:  1. Same as above   Procedure: 1. Left retrograde pyelogram 2. Left ureteral stent exchange 3. Left ureteroscopy with laser lithotripsy  Surgeon: Kathleen ScotlandAshley Aniello Christopoulos, MD  Anesthesia: General  Complications: None  Intraoperative findings: Severe hydroureteronephrosis with marked dilation of the left distal ureter down to the level of the UVJ. 8 mm mid pole stone treated. Lower stones embedded into mucosa and not amenable to treatment. Stent exchange.  EBL: Minimal  Specimens: None  Drains: 6 x 24 French double-J ureteral stent on left  Indication: Kathleen Hutchinson is a 31 y.o. patient with history of recurrent nephrolithiasis and prolonged right distal ureteral obstruction from a stone resulting in ureteral stricture.  After reviewing the management options for treatment, she elected to proceed with the above surgical procedure(s). We have discussed the potential benefits and risks of the procedure, side effects of the proposed treatment, the likelihood of the patient achieving the goals of the procedure, and any potential problems that might occur during the procedure or recuperation. Informed consent has been obtained.  Description of procedure:  The patient was taken to the operating room and general anesthesia was induced.  The patient was placed in the dorsal lithotomy position, prepped and draped in the usual sterile fashion, and preoperative antibiotics were administered. A preoperative time-out was performed.   A 21 French scope was advanced per urethra into the bladder. The previously placed ureteral stent was seen emanating from the left UO. The distal coil was grasped and brought to level of the urethral meatus. The stent was then cannulated using a sensor wire up to level of the kidney  leaving the wire in place and removing the stent. A dual lumen access sheath was then used just within the distal ureter to inject contrast alongside of the wire which revealed markedly severe hydroureteronephrosis all the way down to level of the UVJ. There was significant dilation of the distal ureter in particular with an abrupt transition at the UVJ. A second Super Stiff wire was then introduced up to level of the kidney. A dual lumen 8 French ureteroscope was then advanced over the Super Stiff wire up to level of the kidney without difficulty. This traversed the UVJ without difficulty.  Given the degree of hydronephrosis, greater than 200 cc was aspirated from the kidney prior to navigating the collecting system. There was a stenotic opening into a midpole calyx where an 8 mm stone was identified. A 273  laser fiber was then brought in and using settings of 0.2 J and 40 Hz, the stone was dusted into fine particles approximately the tip of the laser fiber in size. Once it was no significant residual stone fragment, the remainder of the calyces were directly visualized. In the lower pole, there is one calcification in the extreme lower pole which was adherent to the anterior surface of the calyx and not amenable to treatment ureteroscopically. The other small 5 mm stone also appeared to be a Randall's plaque and thereby not clinically significant. The scope was then backed down the length of the ureter carefully inspecting along the way. Again, the capacious nature of the of the left distal ureter was appreciated. At the level of the UVJ, there was a significant narrowing which was mildly hypovascular appearance but no significant or obvious scarlike material. There were no obstructing stone fragments at  this level. The scope was then removed. A 6 x 24 French double-J ureteral stent was then advanced over the safety wire up to level of the renal pelvis. The wire was partially drawn until full coil was noted  within the renal pelvis. The wire was then fully withdrawn and a full coil was noted within the bladder. The bladder was then drained. The patient was then cleaned and dried, repositioned supine position, reversed from anesthesia, taken the PACU in stable condition.  Plan: Patient will return to the office next week to discuss definitive management of her left UVJ stricture. We'll discuss robotic ureteral reimplant.  Kathleen ScotlandAshley Starlena Hutchinson, M.D.

## 2017-07-06 NOTE — Anesthesia Post-op Follow-up Note (Cosign Needed)
Anesthesia QCDR form completed.        

## 2017-07-06 NOTE — Anesthesia Preprocedure Evaluation (Signed)
Anesthesia Evaluation  Patient identified by MRN, date of birth, ID band Patient awake    Reviewed: Allergy & Precautions, H&P , NPO status , Patient's Chart, lab work & pertinent test results  History of Anesthesia Complications Negative for: history of anesthetic complications  Airway Mallampati: III  TM Distance: >3 FB Neck ROM: full    Dental  (+) Poor Dentition, Chipped, Caps   Pulmonary neg shortness of breath, Current Smoker,    Pulmonary exam normal breath sounds clear to auscultation       Cardiovascular Exercise Tolerance: Good (-) angina(-) Past MI and (-) DOE negative cardio ROS Normal cardiovascular exam Rhythm:regular Rate:Normal     Neuro/Psych  Headaches, negative psych ROS   GI/Hepatic negative GI ROS, Neg liver ROS, neg GERD  ,  Endo/Other  negative endocrine ROS  Renal/GU Renal disease     Musculoskeletal   Abdominal   Peds  Hematology negative hematology ROS (+)   Anesthesia Other Findings Past Medical History: No date: Headaches, cluster No date: Nephrolithiasis  Past Surgical History: No date: CESAREAN SECTION 10/02/2016: CYSTOSCOPY/URETEROSCOPY/HOLMIUM LASER/STENT PL* Left No date: TUBAL LIGATION No date: VAGINAL DELIVERY     Comment: x 4  BMI    Body Mass Index:  30.41 kg/m      Reproductive/Obstetrics negative OB ROS                             Anesthesia Physical  Anesthesia Plan  ASA: II  Anesthesia Plan: General ETT   Post-op Pain Management:    Induction: Intravenous  PONV Risk Score and Plan:   Airway Management Planned: Oral ETT  Additional Equipment:   Intra-op Plan:   Post-operative Plan: Extubation in OR  Informed Consent: I have reviewed the patients History and Physical, chart, labs and discussed the procedure including the risks, benefits and alternatives for the proposed anesthesia with the patient or authorized  representative who has indicated his/her understanding and acceptance.   Dental Advisory Given  Plan Discussed with: Anesthesiologist, CRNA and Surgeon  Anesthesia Plan Comments: (Patient consented for risks of anesthesia including but not limited to:  - adverse reactions to medications - damage to teeth, lips or other oral mucosa - sore throat or hoarseness - Damage to heart, brain, lungs or loss of life  Patient voiced understanding.)        Anesthesia Quick Evaluation

## 2017-07-06 NOTE — Anesthesia Postprocedure Evaluation (Signed)
Anesthesia Post Note  Patient: Kathleen Hutchinson  Procedure(s) Performed: Procedure(s) (LRB): CYSTOSCOPY WITH STENT REPLACEMENT (Left) CYSTOSCOPY WITH RETROGRADE PYELOGRAM (Left) URETEROSCOPY WITH HOLMIUM LASER LITHOTRIPSY (Left)  Patient location during evaluation: PACU Anesthesia Type: General Level of consciousness: awake and alert and oriented Pain management: pain level controlled Vital Signs Assessment: post-procedure vital signs reviewed and stable Respiratory status: spontaneous breathing Cardiovascular status: blood pressure returned to baseline Anesthetic complications: no     Last Vitals:  Vitals:   07/06/17 0948 07/06/17 1027  BP: 93/66 (!) 93/52  Pulse: 72 64  Resp: 16   Temp: 36.4 C     Last Pain:  Vitals:   07/06/17 0948  TempSrc: Temporal  PainSc:                  Rohith Fauth

## 2017-07-06 NOTE — Transfer of Care (Signed)
Immediate Anesthesia Transfer of Care Note  Patient: Kathleen Hutchinson  Procedure(s) Performed: Procedure(s): CYSTOSCOPY WITH STENT REPLACEMENT (Left) CYSTOSCOPY WITH RETROGRADE PYELOGRAM (Left) URETEROSCOPY WITH HOLMIUM LASER LITHOTRIPSY (Left)  Patient Location: PACU  Anesthesia Type:General  Level of Consciousness: drowsy and patient cooperative  Airway & Oxygen Therapy: Patient Spontanous Breathing and Patient connected to face mask oxygen  Post-op Assessment: Report given to RN and Post -op Vital signs reviewed and stable  Post vital signs: Reviewed and stable  Last Vitals:  Vitals:   07/06/17 0638 07/06/17 0900  BP: (!) 101/51 100/60  Pulse: 63 74  Resp: 18 (!) 22  Temp: (!) 36.2 C (!) 36.3 C    Last Pain:  Vitals:   07/06/17 0900  PainSc: Asleep         Complications: No apparent anesthesia complications

## 2017-07-06 NOTE — Discharge Instructions (Signed)
You have a ureteral stent in place.  This is a tube that extends from your kidney to your bladder.  This may cause urinary bleeding, burning with urination, and urinary frequency.  Please call our office or present to the ED if you develop fevers >101 or pain which is not able to be controlled with oral pain medications.  You may be given either Flomax and/ or ditropan to help with bladder spasms and stent pain in addition to pain medications.    [facility] [address], [address] [place], [place] [address] [phone] 

## 2017-07-15 ENCOUNTER — Ambulatory Visit (INDEPENDENT_AMBULATORY_CARE_PROVIDER_SITE_OTHER): Payer: BLUE CROSS/BLUE SHIELD | Admitting: Urology

## 2017-07-15 ENCOUNTER — Encounter: Payer: Self-pay | Admitting: Urology

## 2017-07-15 VITALS — BP 96/59 | HR 69 | Ht 68.0 in | Wt 200.0 lb

## 2017-07-15 DIAGNOSIS — N200 Calculus of kidney: Secondary | ICD-10-CM

## 2017-07-15 DIAGNOSIS — N135 Crossing vessel and stricture of ureter without hydronephrosis: Secondary | ICD-10-CM

## 2017-07-15 LAB — URINALYSIS, COMPLETE
Bilirubin, UA: NEGATIVE
Glucose, UA: NEGATIVE
Ketones, UA: NEGATIVE
Nitrite, UA: NEGATIVE
Protein, UA: NEGATIVE
Specific Gravity, UA: 1.01 (ref 1.005–1.030)
Urobilinogen, Ur: 0.2 mg/dL (ref 0.2–1.0)
pH, UA: 6.5 (ref 5.0–7.5)

## 2017-07-15 LAB — MICROSCOPIC EXAMINATION: RBC, UA: NONE SEEN /HPF

## 2017-07-15 NOTE — Progress Notes (Signed)
07/15/2017 3:40 PM   Kathleen Hutchinson 1986-03-26 [id number]  Referring provider: No referring provider defined for this encounter.  Chief Complaint  Patient presents with  . Routine Post Op    HPI: 31 year old female who returns today to discuss definitive management of her severe left distal ureteral stricture. She notes only presented with massive hydroureteronephrosis to the emergency room in 5 2018 in the setting of an Escherichia coli urinary tract infection requiring ureteral stent placement.  She will have further evaluation with a Lasix renogram showing excellent split function with 47% on the left versus 53 on the right.  Most recently, she returned to the operating room for diagnostic ureteroscopy on 07/06/2017. Her stent was exchanged at that time.  Severe hydroureteronephrosis of marked dilation of the left distal ureter down to the level of the UVJ was identified. In addition, an 8 mm midpole stone was treated.  She has a personal history of nephrolithiasis and underwent ureteroscopy in October 2017 in [place]. She is since moved her first social reasons.  Prior to above, she did have an obstructing distal ureteral stone back in 2015. She was scheduled for ureteroscopy with Dr. Toribio Harbour in [place] but on the day of the procedure, she left the preoperative holding area refused to have surgery. She returned 2 years later and ultimately underwent ureteroscopy. Stent remained in place for 2 weeks and was subsequently removed. She never followed up thereafter.   Today, she is doing much better. Since her stent was replaced, she's having fewer stent related issues including improvement in her urinary frequency, urgency, and dysuria. No fevers or chills.    PMH: Past Medical History:  Diagnosis Date  . Headaches, cluster   . Nephrolithiasis     Surgical History: Past Surgical History:  Procedure Laterality Date  . CESAREAN SECTION    . CYSTOSCOPY W/ RETROGRADES  Left 05/01/2017   Procedure: CYSTOSCOPY WITH RETROGRADE PYELOGRAM;  Surgeon: Bjorn Pippin, MD;  Location: [facility];  Service: Urology;  Laterality: Left;  . CYSTOSCOPY W/ RETROGRADES Left 07/06/2017   Procedure: CYSTOSCOPY WITH RETROGRADE PYELOGRAM;  Surgeon: Vanna Scotland, MD;  Location: [facility];  Service: Urology;  Laterality: Left;  . CYSTOSCOPY W/ URETERAL STENT PLACEMENT Left 07/06/2017   Procedure: CYSTOSCOPY WITH STENT REPLACEMENT;  Surgeon: Vanna Scotland, MD;  Location: [facility];  Service: Urology;  Laterality: Left;  . CYSTOSCOPY WITH STENT PLACEMENT Left 05/01/2017   Procedure: CYSTOSCOPY WITH STENT PLACEMENT;  Surgeon: Bjorn Pippin, MD;  Location: [facility];  Service: Urology;  Laterality: Left;  . CYSTOSCOPY/URETEROSCOPY/HOLMIUM LASER/STENT PLACEMENT Left 10/02/2016  . TUBAL LIGATION    . URETEROSCOPY WITH HOLMIUM LASER LITHOTRIPSY Left 07/06/2017   Procedure: URETEROSCOPY WITH HOLMIUM LASER LITHOTRIPSY;  Surgeon: Vanna Scotland, MD;  Location: [facility];  Service: Urology;  Laterality: Left;  Marland Kitchen VAGINAL DELIVERY     x 4    Home Medications:  Allergies as of 07/15/2017      Reactions   Augmentin [amoxicillin-pot Clavulanate] Rash   Has patient had a PCN reaction causing immediate rash, facial/tongue/throat swelling, SOB or lightheadedness with hypotension: Yes Has patient had a PCN reaction causing severe rash involving mucus membranes or skin necrosis: Yes Has patient had a PCN reaction that required hospitalization: No Has patient had a PCN reaction occurring within the last 10 years: Yes If all of the above answers are "NO", then may proceed with Cephalosporin use.      Medication List       Accurate as of  07/15/17 11:59 PM. Always use your most recent med list.          acetaminophen 325 MG tablet Commonly known as:  TYLENOL Take 650 mg by mouth every 6 (six) hours as needed for mild pain.   albuterol 108 (90 Base) MCG/ACT inhaler Commonly known as:  PROVENTIL  HFA;VENTOLIN HFA Inhale into the lungs every 6 (six) hours as needed for wheezing or shortness of breath.   diphenhydrAMINE 25 MG tablet Commonly known as:  BENADRYL Take 25 mg by mouth daily as needed for allergies.   diphenhydramine-acetaminophen 25-500 MG Tabs tablet Commonly known as:  TYLENOL PM Take 2 tablets by mouth at bedtime as needed (sleep).   ENERGY BOOSTER PO Take 1 tablet by mouth 2 (two) times daily. B12   ibuprofen 200 MG tablet Commonly known as:  ADVIL,MOTRIN Take 200 mg by mouth every 6 (six) hours as needed.   multivitamin tablet Take 1 tablet by mouth daily.   naproxen 500 MG tablet Commonly known as:  NAPROSYN Take 500 mg by mouth as needed.   oxybutynin 5 MG tablet Commonly known as:  DITROPAN Take 5 mg by mouth 3 (three) times daily.   tamsulosin 0.4 MG Caps capsule Commonly known as:  FLOMAX Take 0.4 mg by mouth daily.       Allergies:  Allergies  Allergen Reactions  . Augmentin [Amoxicillin-Pot Clavulanate] Rash    Has patient had a PCN reaction causing immediate rash, facial/tongue/throat swelling, SOB or lightheadedness with hypotension: Yes Has patient had a PCN reaction causing severe rash involving mucus membranes or skin necrosis: Yes Has patient had a PCN reaction that required hospitalization: No Has patient had a PCN reaction occurring within the last 10 years: Yes If all of the above answers are "NO", then may proceed with Cephalosporin use.     Family History: Family History  Problem Relation Age of Onset  . Cholecystitis Mother   . Nephrolithiasis Neg Hx   . Migraines Neg Hx     Social History:  reports that she has been smoking Cigarettes.  She has a 2.50 pack-year smoking history. She has never used smokeless tobacco. She reports that she uses drugs, including Marijuana. She reports that she does not drink alcohol.  ROS: UROLOGY Frequent Urination?: No Hard to postpone urination?: No Burning/pain with urination?:  No Get up at night to urinate?: No Leakage of urine?: No Urine stream starts and stops?: No Trouble starting stream?: No Do you have to strain to urinate?: No Blood in urine?: No Urinary tract infection?: No Sexually transmitted disease?: No Injury to kidneys or bladder?: No Painful intercourse?: No Weak stream?: No Currently pregnant?: No Vaginal bleeding?: No Last menstrual period?: n  Gastrointestinal Nausea?: No Vomiting?: No Indigestion/heartburn?: No Diarrhea?: No Constipation?: No  Constitutional Fever: No Night sweats?: No Weight loss?: No Fatigue?: No  Skin Skin rash/lesions?: No Itching?: No  Eyes Blurred vision?: No Double vision?: No  Ears/Nose/Throat Sore throat?: No Sinus problems?: No  Hematologic/Lymphatic Swollen glands?: No Easy bruising?: No  Cardiovascular Leg swelling?: No Chest pain?: No  Respiratory Cough?: No Shortness of breath?: No  Endocrine Excessive thirst?: No  Musculoskeletal Back pain?: No Joint pain?: No  Neurological Headaches?: No Dizziness?: No  Psychologic Depression?: No Anxiety?: No  Physical Exam: BP (!) 96/59   Pulse 69   Ht 5\' 8"  (1.727 m)   Wt 200 lb (90.7 kg)   LMP 07/05/2017 Comment: upreg neg  BMI 30.41 kg/m   Constitutional:  Alert and oriented, No acute distress. HEENT: [place] AT, moist mucus membranes.  Trachea midline, no masses. Cardiovascular: No clubbing, cyanosis, or edema. Respiratory: Normal respiratory effort, no increased work of breathing. GI: Abdomen is soft, nontender, nondistended, no abdominal masses GU: No CVA tenderness.  Skin: No rashes, bruises or suspicious lesions. Neurologic: Grossly intact, no focal deficits, moving all 4 extremities. Psychiatric: Normal mood and affect.  Laboratory Data: Lab Results  Component Value Date   WBC 12.1 (H) 05/01/2017   HGB 13.0 05/01/2017   HCT 38.0 05/01/2017   MCV 90.2 05/01/2017   PLT 203 05/01/2017    Lab Results    Component Value Date   CREATININE 0.75 05/01/2017    Urinalysis Results for orders placed or performed in visit on 07/15/17  CULTURE, URINE COMPREHENSIVE  Result Value Ref Range   Urine Culture, Comprehensive Preliminary report    Organism ID, Bacteria Comment   Microscopic Examination  Result Value Ref Range   WBC, UA 0-5 0 - 5 /hpf   RBC, UA None seen 0 - 2 /hpf   Epithelial Cells (non renal) 0-10 0 - 10 /hpf   Bacteria, UA Few (A) None seen/Few  Urinalysis, Complete  Result Value Ref Range   Specific Gravity, UA 1.010 1.005 - 1.030   pH, UA 6.5 5.0 - 7.5   Color, UA Yellow Yellow   Appearance Ur Cloudy (A) Clear   Leukocytes, UA 1+ (A) Negative   Protein, UA Negative Negative/Trace   Glucose, UA Negative Negative   Ketones, UA Negative Negative   RBC, UA Trace (A) Negative   Bilirubin, UA Negative Negative   Urobilinogen, Ur 0.2 0.2 - 1.0 mg/dL   Nitrite, UA Negative Negative   Microscopic Examination See below:     Pertinent Imaging: No new imaging  Assessment & Plan:    1. Ureteral stricture, left Severe left distal ureteral stricture, confirmed on most recent ureteroscopy with severe hydroureteronephrosis down to the level of the stricture Options for definitive management including endopyelotomy versus ureteral reimplant were discussed. She is most interested in ureteral reimplant. Risks and benefits of each were discussed.  Specifically, she would be a good candidate for robotic ureteral reimplant. She understands the risk of bleeding, infection, damage is running structures, need for ureteral stent, need for Foley catheter, need for hospitalization, reflux of the anastomosis, failure of the procedure, recurrent stricture disease at the reimplant site, amongst others. All questions were answered.  She would proceed as planned. Preop urine culture.  2. Left nephrolithiasis S/p URS, non obstructing stone treated - Urinalysis, Complete - CULTURE, URINE  COMPREHENSIVE  Schedule left robotic ureteral reimplant Vanna ScotlandAshley Keefer Soulliere, MD   [facility] [address], [address] [place], [place] [address] [phone]  I spent 25 min with this patient of which greater than 50% was spent in counseling and coordination of care with the patient.

## 2017-07-15 NOTE — Progress Notes (Signed)
urin

## 2017-07-19 LAB — CULTURE, URINE COMPREHENSIVE

## 2017-07-22 ENCOUNTER — Telehealth: Payer: Self-pay | Admitting: Urology

## 2017-07-22 NOTE — Telephone Encounter (Signed)
Patient called the office this afternoon.  She confirmed the surgery date of 07/27/17 with Dr. Apolinar JunesBrandon.  She wanted to let you know to discard the insurance paperwork that you received.  [employer] will be faxing new paperwork to the office for Dr. Apolinar JunesBrandon to complete for surgery.  She can be reached at [phone] if you have questions.

## 2017-07-23 ENCOUNTER — Other Ambulatory Visit: Payer: Self-pay | Admitting: Radiology

## 2017-07-23 DIAGNOSIS — N135 Crossing vessel and stricture of ureter without hydronephrosis: Secondary | ICD-10-CM

## 2017-07-23 NOTE — Telephone Encounter (Signed)
Notified pt of left robotic ureteral reimplant scheduled with Dr Apolinar JunesBrandon on 07/27/17 [facility] & pre-admit testing appt. On 07/24/17 @12 :00. Questions answered. Pt voices understanding.

## 2017-07-24 ENCOUNTER — Other Ambulatory Visit: Payer: Self-pay | Admitting: Radiology

## 2017-07-24 ENCOUNTER — Encounter
Admission: RE | Admit: 2017-07-24 | Discharge: 2017-07-24 | Disposition: A | Discharge status: Home / Self Care | Payer: BLUE CROSS/BLUE SHIELD | Source: Ambulatory Visit | Attending: Urology | Admitting: Urology

## 2017-07-24 DIAGNOSIS — N135 Crossing vessel and stricture of ureter without hydronephrosis: Secondary | ICD-10-CM

## 2017-07-24 DIAGNOSIS — Z01818 Encounter for other preprocedural examination: Secondary | ICD-10-CM | POA: Diagnosis present

## 2017-07-24 HISTORY — DX: Personal history of urinary calculi: Z87.442

## 2017-07-24 LAB — BASIC METABOLIC PANEL
ANION GAP: 8 (ref 5–15)
BUN: 11 mg/dL (ref 6–20)
CALCIUM: 9.4 mg/dL (ref 8.9–10.3)
CHLORIDE: 105 mmol/L (ref 101–111)
CO2: 23 mmol/L (ref 22–32)
CREATININE: 0.73 mg/dL (ref 0.44–1.00)
GFR calc non Af Amer: >60 mL/min (ref >60)
Glucose, Bld: 91 mg/dL (ref 65–99)
Potassium: 3.7 mmol/L (ref 3.5–5.1)
SODIUM: 136 mmol/L (ref 135–145)

## 2017-07-24 LAB — CBC
HCT: 41.7 % (ref 35.0–47.0)
HEMOGLOBIN: 14.4 g/dL (ref 12.0–16.0)
MCH: 31.1 pg (ref 26.0–34.0)
MCHC: 34.6 g/dL (ref 32.0–36.0)
MCV: 89.8 fL (ref 80.0–100.0)
PLATELETS: 242 10*3/uL (ref 150–440)
RBC: 4.65 MIL/uL (ref 3.80–5.20)
RDW: 13 % (ref 11.5–14.5)
WBC: 10.6 10*3/uL (ref 3.6–11.0)

## 2017-07-24 LAB — TYPE AND SCREEN
ABO/RH(D): O POS
ANTIBODY SCREEN: NEGATIVE

## 2017-07-24 NOTE — Patient Instructions (Addendum)
Your procedure is scheduled on: Monday 07/27/17 Report to DAY SURGERY. 2ND FLOOR MEDICAL MALL ENTRANCE. At 8:15 To find out your arrival time please call [phone] between 1PM - 3PM on TODAY.  Remember: Instructions that are not followed completely may result in serious medical risk, up to and including death, or upon the discretion of your surgeon and anesthesiologist your surgery may need to be rescheduled.    __X__ 1. Do not eat food or drink liquids after midnight. No gum chewing or hard candies.     __X__ 2. No Alcohol for 24 hours before or after surgery.   ____ 3. Bring all medications with you on the day of surgery if instructed.    __X__ 4. Notify your doctor if there is any change in your medical condition     (cold, fever, infections).             ___X__5. No smoking within 24 hours of your surgery.     Do not wear jewelry, make-up, hairpins, clips or nail polish.  Do not wear lotions, powders, or perfumes.   Do not shave 48 hours prior to surgery. Men may shave face and neck.  Do not bring valuables to the hospital.    [facility] is not responsible for any belongings or valuables.               Contacts, dentures or bridgework may not be worn into surgery.  Leave your suitcase in the car. After surgery it may be brought to your room.  For patients admitted to the hospital, discharge time is determined by your                treatment team.   Patients discharged the day of surgery will not be allowed to drive home.   Please read over the following fact sheets that you were given:   MRSA Information   __X__ Take these medicines the morning of surgery with A SIP OF WATER:    1. FLOMAX  2. OXYBUTININ  3.   4.  5.  6.  ____ Fleet Enema (as directed)   __X__ Use CHG Soap as directed  __X__ Use inhalers on the day of surgery  ____ Stop metformin 2 days prior to surgery    ____ Take 1/2 of usual insulin dose the night before surgery and none on the morning of  surgery.   ____ Stop Coumadin/Plavix/aspirin on   __X__ Stop Anti-inflammatories such as Advil, Aleve, Ibuprofen, Motrin, Naproxen, Naprosyn, Goodies,powder, or aspirin products.  OK to take Tylenol. (ALREADY STOPPED)   ____ Stop supplements until after surgery.    ____ Bring C-Pap to the hospital.

## 2017-07-26 MED ORDER — GENTAMICIN IN SALINE 1.6-0.9 MG/ML-% IV SOLN
80.0000 mg | INTRAVENOUS | Status: AC
  Administered 2017-07-27: 80 mg via INTRAVENOUS
  Filled 2017-07-26: qty 50

## 2017-07-26 MED ORDER — CEFAZOLIN SODIUM-DEXTROSE 2-4 GM/100ML-% IV SOLN
2.0000 g | INTRAVENOUS | Status: AC
  Administered 2017-07-27 (×2): 2 g via INTRAVENOUS

## 2017-07-27 ENCOUNTER — Observation Stay: Payer: BLUE CROSS/BLUE SHIELD

## 2017-07-27 ENCOUNTER — Inpatient Hospital Stay
Admission: RE | Admit: 2017-07-27 | Discharge: 2017-07-29 | DRG: 660 | Disposition: A | Discharge status: Home / Self Care | Payer: BLUE CROSS/BLUE SHIELD | Source: Ambulatory Visit | Attending: Urology | Admitting: Urology

## 2017-07-27 ENCOUNTER — Inpatient Hospital Stay: Payer: BLUE CROSS/BLUE SHIELD | Admitting: Anesthesiology

## 2017-07-27 ENCOUNTER — Encounter: Payer: Self-pay | Admitting: *Deleted

## 2017-07-27 ENCOUNTER — Encounter
Admission: RE | Disposition: A | Discharge status: Home / Self Care | Payer: Self-pay | Source: Ambulatory Visit | Attending: Urology

## 2017-07-27 DIAGNOSIS — Z87442 Personal history of urinary calculi: Secondary | ICD-10-CM

## 2017-07-27 DIAGNOSIS — F17210 Nicotine dependence, cigarettes, uncomplicated: Secondary | ICD-10-CM | POA: Diagnosis present

## 2017-07-27 DIAGNOSIS — N135 Crossing vessel and stricture of ureter without hydronephrosis: Secondary | ICD-10-CM | POA: Diagnosis present

## 2017-07-27 DIAGNOSIS — N131 Hydronephrosis with ureteral stricture, not elsewhere classified: Principal | ICD-10-CM | POA: Diagnosis present

## 2017-07-27 DIAGNOSIS — G44009 Cluster headache syndrome, unspecified, not intractable: Secondary | ICD-10-CM | POA: Diagnosis present

## 2017-07-27 HISTORY — PX: ROBOTICALLY ASSISTED LAPAROSCOPIC URETERAL RE-IMPLANTATION: SHX6481

## 2017-07-27 LAB — URINE DRUG SCREEN, QUALITATIVE (ARMC ONLY)
AMPHETAMINES, UR SCREEN: NOT DETECTED
BENZODIAZEPINE, UR SCRN: NOT DETECTED
Barbiturates, Ur Screen: NOT DETECTED
Cannabinoid 50 Ng, Ur ~~LOC~~: POSITIVE — AB
Cocaine Metabolite,Ur ~~LOC~~: NOT DETECTED
MDMA (Ecstasy)Ur Screen: NOT DETECTED
METHADONE SCREEN, URINE: NOT DETECTED
OPIATE, UR SCREEN: NOT DETECTED
Phencyclidine (PCP) Ur S: NOT DETECTED
Tricyclic, Ur Screen: NOT DETECTED

## 2017-07-27 LAB — ABO/RH: ABO/RH(D): O POS

## 2017-07-27 LAB — POCT PREGNANCY, URINE: Preg Test, Ur: NEGATIVE

## 2017-07-27 SURGERY — ROBOTICALLY ASSISTED LAPAROSCOPIC URETERAL RE-IMPLANTATION
Anesthesia: General | Site: Ureter | Laterality: Left | Wound class: Clean Contaminated

## 2017-07-27 MED ORDER — OXYBUTYNIN CHLORIDE 5 MG PO TABS: 5.0000 mg | ORAL_TABLET | Freq: Three times a day (TID) | ORAL | Status: DC | PRN

## 2017-07-27 MED ORDER — ONDANSETRON HCL 4 MG/2ML IJ SOLN
INTRAMUSCULAR | Status: AC
  Filled 2017-07-27: qty 2

## 2017-07-27 MED ORDER — ONDANSETRON HCL 4 MG/2ML IJ SOLN: 4.0000 mg | INTRAMUSCULAR | Status: DC | PRN

## 2017-07-27 MED ORDER — BUPIVACAINE HCL 0.5 % IJ SOLN
INTRAMUSCULAR | Status: DC | PRN
  Administered 2017-07-27: 10 mL

## 2017-07-27 MED ORDER — HYDROMORPHONE HCL 1 MG/ML IJ SOLN
INTRAMUSCULAR | Status: DC | PRN
  Administered 2017-07-27: 0.5 mg via INTRAVENOUS
  Administered 2017-07-27: .5 mg via INTRAVENOUS

## 2017-07-27 MED ORDER — FENTANYL CITRATE (PF) 250 MCG/5ML IJ SOLN
INTRAMUSCULAR | Status: AC
  Filled 2017-07-27: qty 5

## 2017-07-27 MED ORDER — ACETAMINOPHEN 10 MG/ML IV SOLN
INTRAVENOUS | Status: AC
  Filled 2017-07-27: qty 100

## 2017-07-27 MED ORDER — LIDOCAINE HCL (CARDIAC) 20 MG/ML IV SOLN
INTRAVENOUS | Status: DC | PRN
  Administered 2017-07-27: 100 mg via INTRAVENOUS

## 2017-07-27 MED ORDER — BELLADONNA ALKALOIDS-OPIUM 16.2-60 MG RE SUPP: 1.0000 | Freq: Four times a day (QID) | RECTAL | Status: DC | PRN

## 2017-07-27 MED ORDER — OXYCODONE-ACETAMINOPHEN 5-325 MG PO TABS
1.0000 | ORAL_TABLET | ORAL | Status: DC | PRN
  Administered 2017-07-27 (×2): 1 via ORAL
  Administered 2017-07-28 (×4): 2 via ORAL
  Administered 2017-07-29 (×2): 1 via ORAL
  Administered 2017-07-29: 2 via ORAL
  Filled 2017-07-27 (×2): qty 2
  Filled 2017-07-27 (×4): qty 1
  Filled 2017-07-27 (×3): qty 2

## 2017-07-27 MED ORDER — DEXAMETHASONE SODIUM PHOSPHATE 10 MG/ML IJ SOLN
INTRAMUSCULAR | Status: DC | PRN
  Administered 2017-07-27: 10 mg via INTRAVENOUS

## 2017-07-27 MED ORDER — SUGAMMADEX SODIUM 200 MG/2ML IV SOLN
INTRAVENOUS | Status: DC | PRN
  Administered 2017-07-27: 400 mg via INTRAVENOUS

## 2017-07-27 MED ORDER — SODIUM CHLORIDE 0.9 % IV SOLN
INTRAVENOUS | Status: DC
  Administered 2017-07-27: 17:00:00 via INTRAVENOUS

## 2017-07-27 MED ORDER — CEFAZOLIN SODIUM-DEXTROSE 2-4 GM/100ML-% IV SOLN
INTRAVENOUS | Status: AC
  Filled 2017-07-27: qty 100

## 2017-07-27 MED ORDER — SEVOFLURANE IN SOLN
RESPIRATORY_TRACT | Status: AC
  Filled 2017-07-27: qty 250

## 2017-07-27 MED ORDER — PROPOFOL 10 MG/ML IV BOLUS
INTRAVENOUS | Status: DC | PRN
  Administered 2017-07-27: 200 mg via INTRAVENOUS

## 2017-07-27 MED ORDER — FAMOTIDINE 20 MG PO TABS
ORAL_TABLET | ORAL | Status: AC
  Administered 2017-07-27: 20 mg
  Filled 2017-07-27: qty 1

## 2017-07-27 MED ORDER — ALBUTEROL SULFATE (2.5 MG/3ML) 0.083% IN NEBU: 2.5000 mg | INHALATION_SOLUTION | Freq: Four times a day (QID) | RESPIRATORY_TRACT | Status: DC | PRN

## 2017-07-27 MED ORDER — MIDAZOLAM HCL 2 MG/2ML IJ SOLN
INTRAMUSCULAR | Status: AC
  Filled 2017-07-27: qty 2

## 2017-07-27 MED ORDER — ACETAMINOPHEN 325 MG PO TABS: 650.0000 mg | ORAL_TABLET | ORAL | Status: DC | PRN

## 2017-07-27 MED ORDER — KETOROLAC TROMETHAMINE 30 MG/ML IJ SOLN
INTRAMUSCULAR | Status: DC | PRN
  Administered 2017-07-27: 15 mg via INTRAVENOUS

## 2017-07-27 MED ORDER — DEXMEDETOMIDINE HCL IN NACL 200 MCG/50ML IV SOLN
INTRAVENOUS | Status: AC
  Filled 2017-07-27: qty 50

## 2017-07-27 MED ORDER — DIPHENHYDRAMINE HCL 50 MG/ML IJ SOLN: 12.5000 mg | Freq: Four times a day (QID) | INTRAMUSCULAR | Status: DC | PRN

## 2017-07-27 MED ORDER — DIPHENHYDRAMINE HCL 12.5 MG/5ML PO ELIX
12.5000 mg | ORAL_SOLUTION | Freq: Four times a day (QID) | ORAL | Status: DC | PRN
  Filled 2017-07-27: qty 5

## 2017-07-27 MED ORDER — PROPOFOL 10 MG/ML IV BOLUS
INTRAVENOUS | Status: AC
  Filled 2017-07-27: qty 20

## 2017-07-27 MED ORDER — MORPHINE SULFATE (PF) 2 MG/ML IV SOLN
2.0000 mg | INTRAVENOUS | Status: DC | PRN
  Administered 2017-07-27 – 2017-07-28 (×3): 2 mg via INTRAVENOUS
  Administered 2017-07-28: 4 mg via INTRAVENOUS
  Filled 2017-07-27: qty 1
  Filled 2017-07-27: qty 2
  Filled 2017-07-27 (×2): qty 1

## 2017-07-27 MED ORDER — DOCUSATE SODIUM 100 MG PO CAPS
100.0000 mg | ORAL_CAPSULE | Freq: Two times a day (BID) | ORAL | Status: DC
  Administered 2017-07-27 – 2017-07-29 (×4): 100 mg via ORAL
  Filled 2017-07-27 (×4): qty 1

## 2017-07-27 MED ORDER — ROCURONIUM BROMIDE 100 MG/10ML IV SOLN
INTRAVENOUS | Status: DC | PRN
  Administered 2017-07-27 (×2): 20 mg via INTRAVENOUS
  Administered 2017-07-27: 10 mg via INTRAVENOUS

## 2017-07-27 MED ORDER — TAMSULOSIN HCL 0.4 MG PO CAPS
0.4000 mg | ORAL_CAPSULE | Freq: Every day | ORAL | Status: DC
  Administered 2017-07-27 – 2017-07-29 (×3): 0.4 mg via ORAL
  Filled 2017-07-27 (×3): qty 1

## 2017-07-27 MED ORDER — ONDANSETRON HCL 4 MG/2ML IJ SOLN: 4.0000 mg | Freq: Once | INTRAMUSCULAR | Status: DC | PRN

## 2017-07-27 MED ORDER — THROMBIN 5000 UNITS EX SOLR
CUTANEOUS | Status: AC
  Filled 2017-07-27: qty 5000

## 2017-07-27 MED ORDER — FENTANYL CITRATE (PF) 100 MCG/2ML IJ SOLN
INTRAMUSCULAR | Status: DC | PRN
  Administered 2017-07-27 (×4): 50 ug via INTRAVENOUS

## 2017-07-27 MED ORDER — HEPARIN SODIUM (PORCINE) 5000 UNIT/ML IJ SOLN
5000.0000 [IU] | Freq: Three times a day (TID) | INTRAMUSCULAR | Status: DC
  Administered 2017-07-27 – 2017-07-29 (×5): 5000 [IU] via SUBCUTANEOUS
  Filled 2017-07-27 (×5): qty 1

## 2017-07-27 MED ORDER — FENTANYL CITRATE (PF) 100 MCG/2ML IJ SOLN
INTRAMUSCULAR | Status: AC
  Filled 2017-07-27: qty 2

## 2017-07-27 MED ORDER — ONDANSETRON HCL 4 MG/2ML IJ SOLN
INTRAMUSCULAR | Status: DC | PRN
  Administered 2017-07-27: 4 mg via INTRAVENOUS

## 2017-07-27 MED ORDER — LACTATED RINGERS IV SOLN
INTRAVENOUS | Status: DC
  Administered 2017-07-27 (×2): via INTRAVENOUS

## 2017-07-27 MED ORDER — ACETAMINOPHEN 10 MG/ML IV SOLN
INTRAVENOUS | Status: DC | PRN
  Administered 2017-07-27: 1000 mg via INTRAVENOUS

## 2017-07-27 MED ORDER — BUPIVACAINE HCL (PF) 0.5 % IJ SOLN
INTRAMUSCULAR | Status: AC
  Filled 2017-07-27: qty 30

## 2017-07-27 MED ORDER — HYDROMORPHONE HCL 1 MG/ML IJ SOLN
INTRAMUSCULAR | Status: AC
  Filled 2017-07-27: qty 1

## 2017-07-27 MED ORDER — FENTANYL CITRATE (PF) 100 MCG/2ML IJ SOLN: 25.0000 ug | INTRAMUSCULAR | Status: DC | PRN

## 2017-07-27 SURGICAL SUPPLY — 97 items
ANCHOR TIS RET SYS 235ML (MISCELLANEOUS) IMPLANT
APPLICATOR SURGIFLO ENDO (HEMOSTASIS) IMPLANT
BAG URO DRAIN 2000ML W/SPOUT (MISCELLANEOUS) ×2 IMPLANT
BNDG COHESIVE 4X5 TAN STRL (GAUZE/BANDAGES/DRESSINGS) IMPLANT
BULB RESERV EVAC DRAIN JP 100C (MISCELLANEOUS) ×2 IMPLANT
CANISTER SUCT 1200ML W/VALVE (MISCELLANEOUS) ×2 IMPLANT
CATH FOL 3WAY LX 18X30 (CATHETERS) ×2 IMPLANT
CHLORAPREP W/TINT 26ML (MISCELLANEOUS) ×2 IMPLANT
CLIP SUT LAPRA TY ABSORB (SUTURE) IMPLANT
CLIP VESOLOCK LG 6/CT PURPLE (CLIP) IMPLANT
CORD BIP STRL DISP 12FT (MISCELLANEOUS) ×2 IMPLANT
COVER TIP SHEARS 8 DVNC (MISCELLANEOUS) ×1 IMPLANT
COVER TIP SHEARS 8MM DA VINCI (MISCELLANEOUS) ×1
DEFOGGER SCOPE WARMER CLEARIFY (MISCELLANEOUS) ×2 IMPLANT
DERMABOND ADVANCED (GAUZE/BANDAGES/DRESSINGS) ×1
DERMABOND ADVANCED .7 DNX12 (GAUZE/BANDAGES/DRESSINGS) ×1 IMPLANT
DRAIN CHANNEL JP 19F (MISCELLANEOUS) ×2 IMPLANT
DRAPE LEGGINS SURG 28X43 STRL (DRAPES) ×2 IMPLANT
DRAPE SHEET LG 3/4 BI-LAMINATE (DRAPES) ×2 IMPLANT
DRAPE SURG 17X11 SM STRL (DRAPES) ×8 IMPLANT
DRAPE UNDER BUTTOCK W/FLU (DRAPES) ×2 IMPLANT
DRIVER LRG NEEDLE DA VINCI (INSTRUMENTS)
DRIVER NDLE LRG DVNC (INSTRUMENTS) IMPLANT
DRSG TEGADERM 8X12 (GAUZE/BANDAGES/DRESSINGS) ×2 IMPLANT
ELECT REM PT RETURN 9FT ADLT (ELECTROSURGICAL) ×2
ELECTRODE REM PT RTRN 9FT ADLT (ELECTROSURGICAL) ×1 IMPLANT
GLOVE BIO SURGEON STRL SZ 6.5 (GLOVE) ×8 IMPLANT
GLOVE BIOGEL PI IND STRL 6.5 (GLOVE) ×2 IMPLANT
GLOVE BIOGEL PI INDICATOR 6.5 (GLOVE) ×2
GLOVE INDICATOR 7.0 STRL GRN (GLOVE) ×8 IMPLANT
GOWN STRL REUS W/ TWL LRG LVL3 (GOWN DISPOSABLE) ×6 IMPLANT
GOWN STRL REUS W/TWL LRG LVL3 (GOWN DISPOSABLE) ×6
GRASPER DBL FENESTRATED (INSTRUMENTS)
GRASPER DBL FENESTRATED DVNC (INSTRUMENTS) IMPLANT
GRASPER SUT TROCAR 14GX15 (MISCELLANEOUS) ×2 IMPLANT
HEMOSTAT SURGICEL 2X14 (HEMOSTASIS) IMPLANT
IRRIGATION STRYKERFLOW (MISCELLANEOUS) ×1 IMPLANT
IRRIGATOR STRYKERFLOW (MISCELLANEOUS) ×2
IV NS 1000ML (IV SOLUTION) ×1
IV NS 1000ML BAXH (IV SOLUTION) ×1 IMPLANT
KIT ACCESSORY DA VINCI DISP (KITS) ×1
KIT ACCESSORY DVNC DISP (KITS) ×1 IMPLANT
KIT PINK PAD W/HEAD ARE REST (MISCELLANEOUS) ×2
KIT PINK PAD W/HEAD ARM REST (MISCELLANEOUS) ×1 IMPLANT
KIT RM TURNOVER STRD PROC AR (KITS) ×2 IMPLANT
LABEL OR SOLS (LABEL) ×2 IMPLANT
LOOP RED MAXI  1X406MM (MISCELLANEOUS) ×1
LOOP VESSEL MAXI 1X406 RED (MISCELLANEOUS) ×1 IMPLANT
MARKER SKIN DUAL TIP RULER LAB (MISCELLANEOUS) ×2 IMPLANT
MARKER SKIN W/RULER 31145785 (MISCELLANEOUS) ×2 IMPLANT
NEEDLE HYPO 25X1 1.5 SAFETY (NEEDLE) ×2 IMPLANT
NEEDLE INSUFFLATION 14GA 120MM (NEEDLE) ×2 IMPLANT
NS IRRIG 500ML POUR BTL (IV SOLUTION) ×2 IMPLANT
PACK LAP CHOLECYSTECTOMY (MISCELLANEOUS) ×2 IMPLANT
PENCIL ELECTRO HAND CTR (MISCELLANEOUS) ×2 IMPLANT
PROBE ULTRASOUND PROART (MISCELLANEOUS) IMPLANT
PROGRASP ENDOWRIST DA VINCI (INSTRUMENTS)
PROGRASP ENDOWRIST DVNC (INSTRUMENTS) IMPLANT
SLEEVE ENDOPATH XCEL 5M (ENDOMECHANICALS) IMPLANT
SOLUTION ELECTROLUBE (MISCELLANEOUS) ×2 IMPLANT
SPOGE SURGIFLO 8M (HEMOSTASIS)
SPONGE LAP 4X18 5PK (MISCELLANEOUS) ×2 IMPLANT
SPONGE SURGIFLO 8M (HEMOSTASIS) IMPLANT
SPONGE VERSALON 4X4 4PLY (MISCELLANEOUS) IMPLANT
STAPLE RELOAD 2.5MM WHITE (STAPLE) IMPLANT
STAPLER SKIN PROX 35W (STAPLE) IMPLANT
STAPLER VASCULAR ECHELON 35 (CUTTER) IMPLANT
STENT URET 6FRX24 CONTOUR (STENTS) ×2 IMPLANT
STRAP SAFETY BODY (MISCELLANEOUS) ×6 IMPLANT
SUT DVC VLOC 90 3-0 CV23 UNDY (SUTURE) ×2 IMPLANT
SUT ETHILON 3-0 FS-10 30 BLK (SUTURE) ×2
SUT MNCRL AB 4-0 PS2 18 (SUTURE) ×4 IMPLANT
SUT PDS PLUS 0 (SUTURE)
SUT PDS PLUS AB 0 CT-2 (SUTURE) IMPLANT
SUT PROLENE 4 0 RB 1 (SUTURE)
SUT PROLENE 4-0 RB1 .5 CRCL 36 (SUTURE) IMPLANT
SUT VIC AB 0 CT1 36 (SUTURE) ×4 IMPLANT
SUT VIC AB 2-0 SH 27 (SUTURE)
SUT VIC AB 2-0 SH 27XBRD (SUTURE) IMPLANT
SUT VIC AB 3-0 SH 27 (SUTURE) ×1
SUT VIC AB 3-0 SH 27X BRD (SUTURE) ×1 IMPLANT
SUT VIC AB 4-0 RB1 27 (SUTURE) ×5
SUT VIC AB 4-0 RB1 27X BRD (SUTURE) ×5 IMPLANT
SUT VICRYL 0 AB UR-6 (SUTURE) IMPLANT
SUT VICRYL PLUS ABS 0 54 (SUTURE) IMPLANT
SUT VICRYL+ 3-0 27IN RB-1 (SUTURE) ×2 IMPLANT
SUTURE EHLN 3-0 FS-10 30 BLK (SUTURE) ×1 IMPLANT
TAPE CLOTH 10X20 WHT NS LF (TAPE) ×2 IMPLANT
TAPE CLOTH 2X10 WHT NS LF (TAPE) ×2
TRAY FOLEY W/METER SILVER 16FR (SET/KITS/TRAYS/PACK) IMPLANT
TROCAR 12M 150ML BLUNT (TROCAR) ×2 IMPLANT
TROCAR DISP BLADELESS 8 DVNC (TROCAR) ×1 IMPLANT
TROCAR DISP BLADELESS 8MM (TROCAR) ×1
TROCAR ENDOPATH XCEL 12X100 BL (ENDOMECHANICALS) ×2 IMPLANT
TROCAR XCEL 12X100 BLDLESS (ENDOMECHANICALS) ×2 IMPLANT
TROCAR XCEL NON-BLD 5MMX100MML (ENDOMECHANICALS) ×2 IMPLANT
TUBING INSUFFLATOR HI FLOW (MISCELLANEOUS) ×2 IMPLANT

## 2017-07-27 NOTE — Anesthesia Procedure Notes (Signed)
Procedure Name: Intubation Date/Time: 07/27/2017 10:30 AM Performed by: Justus Memory Pre-anesthesia Checklist: Patient identified, Patient being monitored, Timeout performed, Emergency Drugs available and Suction available Patient Re-evaluated:Patient Re-evaluated prior to induction Oxygen Delivery Method: Circle system utilized Preoxygenation: Pre-oxygenation with 100% oxygen Induction Type: IV induction Ventilation: Mask ventilation without difficulty Laryngoscope Size: Mac and 3 Grade View: Grade I Tube type: Oral Tube size: 7.0 mm Number of attempts: 1 Airway Equipment and Method: Stylet Placement Confirmation: ETT inserted through vocal cords under direct vision,  positive ETCO2 and breath sounds checked- equal and bilateral Secured at: 21 cm Tube secured with: Tape Dental Injury: Teeth and Oropharynx as per pre-operative assessment

## 2017-07-27 NOTE — OR Nursing (Signed)
Lab tech in for abo/rh draw 

## 2017-07-27 NOTE — Anesthesia Preprocedure Evaluation (Addendum)
Anesthesia Evaluation  Patient identified by MRN, date of birth, ID band Patient awake    Reviewed: Allergy & Precautions, NPO status , Patient's Chart, lab work & pertinent test results, reviewed documented beta blocker date and time   Airway Mallampati: III  TM Distance: >3 FB     Dental  (+) Chipped, Missing   Pulmonary Current Smoker,           Cardiovascular      Neuro/Psych  Headaches,    GI/Hepatic   Endo/Other    Renal/GU Renal disease     Musculoskeletal   Abdominal   Peds  Hematology   Anesthesia Other Findings Does not take inhalationals.  Reproductive/Obstetrics                            Anesthesia Physical Anesthesia Plan  ASA: II  Anesthesia Plan: General   Post-op Pain Management:    Induction: Intravenous  PONV Risk Score and Plan:   Airway Management Planned: Oral ETT  Additional Equipment:   Intra-op Plan:   Post-operative Plan:   Informed Consent: I have reviewed the patients History and Physical, chart, labs and discussed the procedure including the risks, benefits and alternatives for the proposed anesthesia with the patient or authorized representative who has indicated his/her understanding and acceptance.     Plan Discussed with: CRNA  Anesthesia Plan Comments:         Anesthesia Quick Evaluation

## 2017-07-27 NOTE — H&P (View-Only) (Signed)
07/15/2017 3:40 PM   Georges Lynch Delisa 1986-03-26 [id number]  Referring provider: No referring provider defined for this encounter.  Chief Complaint  Patient presents with  . Routine Post Op    HPI: 31 year old female who returns today to discuss definitive management of her severe left distal ureteral stricture. She notes only presented with massive hydroureteronephrosis to the emergency room in 5 2018 in the setting of an Escherichia coli urinary tract infection requiring ureteral stent placement.  She will have further evaluation with a Lasix renogram showing excellent split function with 47% on the left versus 53 on the right.  Most recently, she returned to the operating room for diagnostic ureteroscopy on 07/06/2017. Her stent was exchanged at that time.  Severe hydroureteronephrosis of marked dilation of the left distal ureter down to the level of the UVJ was identified. In addition, an 8 mm midpole stone was treated.  She has a personal history of nephrolithiasis and underwent ureteroscopy in October 2017 in [place]. She is since moved her first social reasons.  Prior to above, she did have an obstructing distal ureteral stone back in 2015. She was scheduled for ureteroscopy with Dr. Toribio Harbour in [place] but on the day of the procedure, she left the preoperative holding area refused to have surgery. She returned 2 years later and ultimately underwent ureteroscopy. Stent remained in place for 2 weeks and was subsequently removed. She never followed up thereafter.   Today, she is doing much better. Since her stent was replaced, she's having fewer stent related issues including improvement in her urinary frequency, urgency, and dysuria. No fevers or chills.    PMH: Past Medical History:  Diagnosis Date  . Headaches, cluster   . Nephrolithiasis     Surgical History: Past Surgical History:  Procedure Laterality Date  . CESAREAN SECTION    . CYSTOSCOPY W/ RETROGRADES  Left 05/01/2017   Procedure: CYSTOSCOPY WITH RETROGRADE PYELOGRAM;  Surgeon: Bjorn Pippin, MD;  Location: [facility];  Service: Urology;  Laterality: Left;  . CYSTOSCOPY W/ RETROGRADES Left 07/06/2017   Procedure: CYSTOSCOPY WITH RETROGRADE PYELOGRAM;  Surgeon: Vanna Scotland, MD;  Location: [facility];  Service: Urology;  Laterality: Left;  . CYSTOSCOPY W/ URETERAL STENT PLACEMENT Left 07/06/2017   Procedure: CYSTOSCOPY WITH STENT REPLACEMENT;  Surgeon: Vanna Scotland, MD;  Location: [facility];  Service: Urology;  Laterality: Left;  . CYSTOSCOPY WITH STENT PLACEMENT Left 05/01/2017   Procedure: CYSTOSCOPY WITH STENT PLACEMENT;  Surgeon: Bjorn Pippin, MD;  Location: [facility];  Service: Urology;  Laterality: Left;  . CYSTOSCOPY/URETEROSCOPY/HOLMIUM LASER/STENT PLACEMENT Left 10/02/2016  . TUBAL LIGATION    . URETEROSCOPY WITH HOLMIUM LASER LITHOTRIPSY Left 07/06/2017   Procedure: URETEROSCOPY WITH HOLMIUM LASER LITHOTRIPSY;  Surgeon: Vanna Scotland, MD;  Location: [facility];  Service: Urology;  Laterality: Left;  Marland Kitchen VAGINAL DELIVERY     x 4    Home Medications:  Allergies as of 07/15/2017      Reactions   Augmentin [amoxicillin-pot Clavulanate] Rash   Has patient had a PCN reaction causing immediate rash, facial/tongue/throat swelling, SOB or lightheadedness with hypotension: Yes Has patient had a PCN reaction causing severe rash involving mucus membranes or skin necrosis: Yes Has patient had a PCN reaction that required hospitalization: No Has patient had a PCN reaction occurring within the last 10 years: Yes If all of the above answers are "NO", then may proceed with Cephalosporin use.      Medication List       Accurate as of  07/15/17 11:59 PM. Always use your most recent med list.          acetaminophen 325 MG tablet Commonly known as:  TYLENOL Take 650 mg by mouth every 6 (six) hours as needed for mild pain.   albuterol 108 (90 Base) MCG/ACT inhaler Commonly known as:  PROVENTIL  HFA;VENTOLIN HFA Inhale into the lungs every 6 (six) hours as needed for wheezing or shortness of breath.   diphenhydrAMINE 25 MG tablet Commonly known as:  BENADRYL Take 25 mg by mouth daily as needed for allergies.   diphenhydramine-acetaminophen 25-500 MG Tabs tablet Commonly known as:  TYLENOL PM Take 2 tablets by mouth at bedtime as needed (sleep).   ENERGY BOOSTER PO Take 1 tablet by mouth 2 (two) times daily. B12   ibuprofen 200 MG tablet Commonly known as:  ADVIL,MOTRIN Take 200 mg by mouth every 6 (six) hours as needed.   multivitamin tablet Take 1 tablet by mouth daily.   naproxen 500 MG tablet Commonly known as:  NAPROSYN Take 500 mg by mouth as needed.   oxybutynin 5 MG tablet Commonly known as:  DITROPAN Take 5 mg by mouth 3 (three) times daily.   tamsulosin 0.4 MG Caps capsule Commonly known as:  FLOMAX Take 0.4 mg by mouth daily.       Allergies:  Allergies  Allergen Reactions  . Augmentin [Amoxicillin-Pot Clavulanate] Rash    Has patient had a PCN reaction causing immediate rash, facial/tongue/throat swelling, SOB or lightheadedness with hypotension: Yes Has patient had a PCN reaction causing severe rash involving mucus membranes or skin necrosis: Yes Has patient had a PCN reaction that required hospitalization: No Has patient had a PCN reaction occurring within the last 10 years: Yes If all of the above answers are "NO", then may proceed with Cephalosporin use.     Family History: Family History  Problem Relation Age of Onset  . Cholecystitis Mother   . Nephrolithiasis Neg Hx   . Migraines Neg Hx     Social History:  reports that she has been smoking Cigarettes.  She has a 2.50 pack-year smoking history. She has never used smokeless tobacco. She reports that she uses drugs, including Marijuana. She reports that she does not drink alcohol.  ROS: UROLOGY Frequent Urination?: No Hard to postpone urination?: No Burning/pain with urination?:  No Get up at night to urinate?: No Leakage of urine?: No Urine stream starts and stops?: No Trouble starting stream?: No Do you have to strain to urinate?: No Blood in urine?: No Urinary tract infection?: No Sexually transmitted disease?: No Injury to kidneys or bladder?: No Painful intercourse?: No Weak stream?: No Currently pregnant?: No Vaginal bleeding?: No Last menstrual period?: n  Gastrointestinal Nausea?: No Vomiting?: No Indigestion/heartburn?: No Diarrhea?: No Constipation?: No  Constitutional Fever: No Night sweats?: No Weight loss?: No Fatigue?: No  Skin Skin rash/lesions?: No Itching?: No  Eyes Blurred vision?: No Double vision?: No  Ears/Nose/Throat Sore throat?: No Sinus problems?: No  Hematologic/Lymphatic Swollen glands?: No Easy bruising?: No  Cardiovascular Leg swelling?: No Chest pain?: No  Respiratory Cough?: No Shortness of breath?: No  Endocrine Excessive thirst?: No  Musculoskeletal Back pain?: No Joint pain?: No  Neurological Headaches?: No Dizziness?: No  Psychologic Depression?: No Anxiety?: No  Physical Exam: BP (!) 96/59   Pulse 69   Ht 5\' 8"  (1.727 m)   Wt 200 lb (90.7 kg)   LMP 07/05/2017 Comment: upreg neg  BMI 30.41 kg/m   Constitutional:  Alert and oriented, No acute distress. HEENT: [place] AT, moist mucus membranes.  Trachea midline, no masses. Cardiovascular: No clubbing, cyanosis, or edema. Respiratory: Normal respiratory effort, no increased work of breathing. GI: Abdomen is soft, nontender, nondistended, no abdominal masses GU: No CVA tenderness.  Skin: No rashes, bruises or suspicious lesions. Neurologic: Grossly intact, no focal deficits, moving all 4 extremities. Psychiatric: Normal mood and affect.  Laboratory Data: Lab Results  Component Value Date   WBC 12.1 (H) 05/01/2017   HGB 13.0 05/01/2017   HCT 38.0 05/01/2017   MCV 90.2 05/01/2017   PLT 203 05/01/2017    Lab Results    Component Value Date   CREATININE 0.75 05/01/2017    Urinalysis Results for orders placed or performed in visit on 07/15/17  CULTURE, URINE COMPREHENSIVE  Result Value Ref Range   Urine Culture, Comprehensive Preliminary report    Organism ID, Bacteria Comment   Microscopic Examination  Result Value Ref Range   WBC, UA 0-5 0 - 5 /hpf   RBC, UA None seen 0 - 2 /hpf   Epithelial Cells (non renal) 0-10 0 - 10 /hpf   Bacteria, UA Few (A) None seen/Few  Urinalysis, Complete  Result Value Ref Range   Specific Gravity, UA 1.010 1.005 - 1.030   pH, UA 6.5 5.0 - 7.5   Color, UA Yellow Yellow   Appearance Ur Cloudy (A) Clear   Leukocytes, UA 1+ (A) Negative   Protein, UA Negative Negative/Trace   Glucose, UA Negative Negative   Ketones, UA Negative Negative   RBC, UA Trace (A) Negative   Bilirubin, UA Negative Negative   Urobilinogen, Ur 0.2 0.2 - 1.0 mg/dL   Nitrite, UA Negative Negative   Microscopic Examination See below:     Pertinent Imaging: No new imaging  Assessment & Plan:    1. Ureteral stricture, left Severe left distal ureteral stricture, confirmed on most recent ureteroscopy with severe hydroureteronephrosis down to the level of the stricture Options for definitive management including endopyelotomy versus ureteral reimplant were discussed. She is most interested in ureteral reimplant. Risks and benefits of each were discussed.  Specifically, she would be a good candidate for robotic ureteral reimplant. She understands the risk of bleeding, infection, damage is running structures, need for ureteral stent, need for Foley catheter, need for hospitalization, reflux of the anastomosis, failure of the procedure, recurrent stricture disease at the reimplant site, amongst others. All questions were answered.  She would proceed as planned. Preop urine culture.  2. Left nephrolithiasis S/p URS, non obstructing stone treated - Urinalysis, Complete - CULTURE, URINE  COMPREHENSIVE  Schedule left robotic ureteral reimplant Vanna ScotlandAshley Kannan Proia, MD   [facility] [address], [address] [place], [place] [address] [phone]  I spent 25 min with this patient of which greater than 50% was spent in counseling and coordination of care with the patient.

## 2017-07-27 NOTE — Op Note (Signed)
07/27/17  PREOPERATIVE DIAGNOSIS:  left distal ureteral stricture, severe left hydroureteronephrosis   POSTOPERATIVE DIAGNOSIS:  same as above   OPERATION PERFORMED: 1. Left robot-assisted ureteroneocystostomy   SURGEON: Vanna Scotland, MD  ASSISTANTS: Kandyce Rud, PA  ANESTHESIA: General.  EBL: 50 cc  SPECIMEN:  left distal ureter   Findings:  Severely hydronephrotic, and dilated and tortuous left distal ureter with scar at the level bladder.  Tension free watertight anastomosis completed at the dome of the bladder, no need for mobilization  of bladder.   INDICATION:  31 year old female with a severe left distal ureteral stricture secondary to a retained ureteral stone. She presents today for definitive management via robotic ureteral reimplant. Risks and benefits previously reviewed in detail. All questions were answered.   PROCEDURE IN DETAIL: Patient was given Ancef preoperatively. She had sequential compression devices applied preoperatively for DVT prophylaxis. She was taken to the operating room where he was induced with general anesthesia. After adequate anesthesia, she was placed in the dorsal lithotomy position. Her arms were draped by her side and was appropriately padded and secured to the operating room table. She was placed in the Trendelenburg position.  She was prepped and draped in sterile fashion. An 15 French Foley (3 way) was placed in the bladder and instilled with 15 cc sterile water. Orogastric tube was placed. The Veress needle was passed just above the umbilicus and the abdomen was insufflated to 15 atmospheres. A 12 mm, blunt-tip trocar was placed just below the umbilicus. The zero-degree camera was passed within this and the following trocars were placed under direct vision; 8 mm robotic trocars were placed 9 cm laterally and inferiorly to the initially placed umbilical trocar. A third one was placed 7 cm lateral to the left-sided  trocar. In the corresponding position on the right side, a 12 mm trocar was placed, and then a 5 mm trocar was placed to the right and well above the umbilicus.  The robot was then docked with the robot trocar. I used the zero-degree camera. I had the hot scissors in the right hand and the left hand with the [place] bipolar and far left hand the Prograsp forceps.   At this point time, the colon was mobilized slightly in the left side. Attention was turned to the iliac vessels where structures including the gonadal vein and ureter were able to be identified. First, the ureter was very difficult to visualize as it was severely dilated and quite torturous. Eventually, I was able to get a vessel loop around this structure. Care was taken to avoid any injury to the iliac vessels. The ureter was then dissected down into the deeper pelvis with care taken to avoid any injury to surrounding structures. I was able to mobilize the left distal ureter with some difficulty all the way down to the level of the bladder. The detrusor was opened slightly until the mucosa was seen bulging. The bladder was then filled. At this point time, scissors were used to excise the distal ureter from the posterior wall of the bladder. This created a small opening into which the bladder could be seen. The ureteral stent which had been previously placed was extracted. The defect from the ureter was then closed using a running 30 barbed Vicryl suture. Once complete, the bladder was filled and drained several times to ensure that this was a watertight closure which it did seem to be. Next, approximately a half a centimeter of the left distal ureter was excised. This  was somewhat firm, difficult to cut, and scarlike. This was passed off the field as left distal ureter. The remainder of the ureter was soft, widely patent, and appeared viable with bleeding edges. Given the already quite dilated nature, it was spatulated less than a half a  millimeter. Upon inspection of the distal ureter as well as the dome of the bladder, it did not appear that any further mobilization of either the ureter or bladder was necessary as the ureter was quite long and tortuous. It did appear that it would be able to achieve a tension-free watertight anastomosis without performing a psoas hitch. This point in time, the bladder was then filled.  A vertical incision was created on the left posterior dome of the bladder to the appropriate size to accommodate reimplant of the ureter. All layers were carefully opened including the mucosa. At this point time the irrigation draining into the field. The posterior and left wall of the anastomosis was then achieved using interrupted 4-0 Vicryl sutures. A wire was then placed proximally into the ureter presumably up to level of the bladder as the wire advanced easily. A 6 x 24 French double-J ureteral stent was then advanced over the wire again presumably to level of the renal pelvis. The wire was withdrawn until coil was noted at the distal aspect. This was dunked into the bladder. The remainder of the anastomosis was then completed. This created a watertight anastomosis. The bladder was filled up to 300 cc and no leak was noted around the ureteroneocystostomy site. This point in time, hemostasis was adequate. Again, inspection of the ureter revealed that this was tension-free less no additional mobilization was performed other than sizing the left anterior peritoneum allowing for additional relaxation of the bladder. Finally, a drain was placed to the lateral most left assistant port. This was placed in the dependent pelvis and secured using a drain stitch.  The instruments were then removed. The robot was undocked and all the trocars were removed under direct vision. There was good Hemostasis.  Both 12 mm port sites were closed using a single interrupted 0 Vicryl suture using Carter-Thomason needle to facilitate this. All the  port sites were irrigated. Lidocaine was injected into all the trocar sites. The skin was closed with 4-0 Monocryl in running subcuticular fashion. Dermabond was applied.   At this point patient was awakened and extubated in the operating room and taken to the recovery room in stable condition. There were no complications. All counts correct.  Vanna Scotland, MD

## 2017-07-27 NOTE — Anesthesia Post-op Follow-up Note (Signed)
Anesthesia QCDR form completed.        

## 2017-07-27 NOTE — Transfer of Care (Signed)
Immediate Anesthesia Transfer of Care Note  Patient: Kathleen Hutchinson  Procedure(s) Performed: Procedure(s): ROBOTICALLY ASSISTED LAPAROSCOPIC URETERAL RE-IMPLANTATION (Left)  Patient Location: PACU  Anesthesia Type:General  Level of Consciousness: sedated  Airway & Oxygen Therapy: Patient Spontanous Breathing and Patient connected to face mask oxygen  Post-op Assessment: Report given to RN and Post -op Vital signs reviewed and stable  Post vital signs: Reviewed and stable  Last Vitals:  Vitals:   07/27/17 0725  BP: (!) 133/120  Pulse: 80  Resp: 16  Temp: (!) 36.4 C  SpO2: 97%    Last Pain:  Vitals:   07/27/17 0725  TempSrc: Oral  PainSc: 7          Complications: No apparent anesthesia complications

## 2017-07-27 NOTE — Interval H&P Note (Signed)
History and Physical Interval Note:  07/27/2017 7:49 AM  Kathleen Hutchinson  has presented today for surgery, with the diagnosis of left ureteral stricture  The various methods of treatment have been discussed with the patient and family. After consideration of risks, benefits and other options for treatment, the patient has consented to  Procedure(s): ROBOTICALLY ASSISTED LAPAROSCOPIC URETERAL RE-IMPLANTATION (Left) as a surgical intervention .  The patient's history has been reviewed, patient examined, no change in status, stable for surgery.  I have reviewed the patient's chart and labs.  Questions were answered to the patient's satisfaction.    RRR CTAB  Vanna Scotland

## 2017-07-28 ENCOUNTER — Encounter: Payer: Self-pay | Admitting: Urology

## 2017-07-28 DIAGNOSIS — N132 Hydronephrosis with renal and ureteral calculous obstruction: Secondary | ICD-10-CM | POA: Diagnosis present

## 2017-07-28 DIAGNOSIS — G44009 Cluster headache syndrome, unspecified, not intractable: Secondary | ICD-10-CM | POA: Diagnosis present

## 2017-07-28 DIAGNOSIS — Z87442 Personal history of urinary calculi: Secondary | ICD-10-CM | POA: Diagnosis not present

## 2017-07-28 DIAGNOSIS — F17210 Nicotine dependence, cigarettes, uncomplicated: Secondary | ICD-10-CM | POA: Diagnosis present

## 2017-07-28 DIAGNOSIS — N131 Hydronephrosis with ureteral stricture, not elsewhere classified: Secondary | ICD-10-CM | POA: Diagnosis present

## 2017-07-28 LAB — BASIC METABOLIC PANEL
Anion gap: 5 (ref 5–15)
BUN: 11 mg/dL (ref 6–20)
CO2: 25 mmol/L (ref 22–32)
Calcium: 8.4 mg/dL — ABNORMAL LOW (ref 8.9–10.3)
Chloride: 109 mmol/L (ref 101–111)
Creatinine, Ser: 0.67 mg/dL (ref 0.44–1.00)
GFR calc non Af Amer: >60 mL/min (ref >60)
Glucose, Bld: 118 mg/dL — ABNORMAL HIGH (ref 65–99)
Potassium: 3.9 mmol/L (ref 3.5–5.1)
SODIUM: 139 mmol/L (ref 135–145)

## 2017-07-28 LAB — CREATININE, FLUID (PLEURAL, PERITONEAL, JP DRAINAGE): Creat, Fluid: 0.6 mg/dL

## 2017-07-28 LAB — HEMOGLOBIN AND HEMATOCRIT, BLOOD
HEMATOCRIT: 36.1 % (ref 35.0–47.0)
Hemoglobin: 12.5 g/dL (ref 12.0–16.0)

## 2017-07-28 MED ORDER — OXYCODONE-ACETAMINOPHEN 5-325 MG PO TABS: 1.0000 | ORAL_TABLET | ORAL | 0 refills | Status: DC | PRN

## 2017-07-28 NOTE — Progress Notes (Signed)
1 Day Post-Op Subjective: The patient is doing well.  No nausea or vomiting. Pain is adequately controlled.  Not yet OOB.  Objective: Vital signs in last 24 hours: Temp:  [97.5 F (36.4 C)-98.3 F (36.8 C)] 98.2 F (36.8 C) (08/21 0904) Pulse Rate:  [54-84] 63 (08/21 0904) Resp:  [11-19] 18 (08/21 0415) BP: (98-111)/(55-71) 108/69 (08/21 0904) SpO2:  [95 %-98 %] 97 % (08/21 0904)  Intake/Output from previous day: 08/20 0701 - 08/21 0700 In: 1949 [I.V.:1949] Out: 2320 [Urine:2250; Drains:20; Blood:50] Intake/Output this shift: Total I/O In: 1984.7 [P.O.:120; I.V.:1864.7] Out: 5 [Drains:5]  Physical Exam:  General: Alert and oriented. CV: RRR Lungs: Clear bilaterally. GI: Soft, Nondistended. Incisions: Clean and dry.  JP with serosanguinous fluid.   Urine: Pink tinged, Foley in place Extremities: Nontender, no erythema, no edema.  Lab Results:  Recent Labs  07/28/17 0351  HGB 12.5  HCT 36.1          Recent Labs  07/24/17 1250 07/28/17 0351  CREATININE 0.73 0.67           Results for orders placed or performed during the hospital encounter of 07/27/17 (from the past 24 hour(s))  Basic metabolic panel     Status: Abnormal   Collection Time: 07/28/17  3:51 AM  Result Value Ref Range   Sodium 139 135 - 145 mmol/L   Potassium 3.9 3.5 - 5.1 mmol/L   Chloride 109 101 - 111 mmol/L   CO2 25 22 - 32 mmol/L   Glucose, Bld 118 (H) 65 - 99 mg/dL   BUN 11 6 - 20 mg/dL   Creatinine, Ser 6.14 0.44 - 1.00 mg/dL   Calcium 8.4 (L) 8.9 - 10.3 mg/dL   GFR calc non Af Amer >60 >60 mL/min   GFR calc Af Amer >60 >60 mL/min   Anion gap 5 5 - 15  Hemoglobin and hematocrit, blood     Status: None   Collection Time: 07/28/17  3:51 AM  Result Value Ref Range   Hemoglobin 12.5 12.0 - 16.0 g/dL   HCT 43.1 54.0 - 08.6 %    Assessment/Plan: POD# 1 s/p robotic left ureteral reimplant.  1) Ambulate, Incentive spirometry 2) Regular diet 3) Transition to oral pain medication 4)  Drain Cr today 5) Likely discharge tomorrow   LOS: 1 day   Vanna Scotland 07/28/2017, 10:57 AM

## 2017-07-28 NOTE — Anesthesia Postprocedure Evaluation (Signed)
Anesthesia Post Note  Patient: Maran Sato  Procedure(s) Performed: Procedure(s) (LRB): ROBOTICALLY ASSISTED LAPAROSCOPIC URETERAL RE-IMPLANTATION (Left)  Patient location during evaluation: PACU Anesthesia Type: General Level of consciousness: awake and alert Pain management: pain level controlled Vital Signs Assessment: post-procedure vital signs reviewed and stable Respiratory status: spontaneous breathing, nonlabored ventilation, respiratory function stable and patient connected to nasal cannula oxygen Cardiovascular status: blood pressure returned to baseline and stable Postop Assessment: no signs of nausea or vomiting Anesthetic complications: no     Last Vitals:  Vitals:   07/28/17 1633 07/28/17 1732  BP: (!) 102/56 105/77  Pulse: (!) 57 72  Resp:    Temp: 36.6 C (!) 36.3 C  SpO2: 97% 98%    Last Pain:  Vitals:   07/28/17 1827  TempSrc:   PainSc: 10-Worst pain ever                 Moshe Wenger S

## 2017-07-28 NOTE — Plan of Care (Signed)
Problem: Physical Regulation: Goal: Postoperative complications will be avoided or minimized Outcome: Progressing Encouraged use of IS, Cough and deep breath teach back, and ambulation

## 2017-07-29 ENCOUNTER — Telehealth: Payer: Self-pay | Admitting: Urology

## 2017-07-29 NOTE — Discharge Instructions (Signed)
· Activity:  You are encouraged to ambulate frequently (about every hour during waking hours) to help prevent blood clots from forming in your legs or lungs.  However, you should not engage in any heavy lifting (> 5-10 lbs), strenuous activity, or straining. ° °· Diet: You should advance your diet as instructed by your physician.  It will be normal to have some bloating, nausea, and abdominal discomfort intermittently. ° °· Prescriptions:  You will be provided a prescription for pain medication to take as needed.  If your pain is not severe enough to require the prescription pain medication, you may take extra strength Tylenol instead which will have less side effects.  You should also take a prescribed stool softener to avoid straining with bowel movements as the prescription pain medication may constipate you. ° °· Incisions: You may remove your dressing bandages 48 hours after surgery if not removed in the hospital.  You will either have some small staples or special tissue glue at each of the incision sites. Once the bandages are removed (if present), the incisions may stay open to air.  You may start showering (but not soaking or bathing in water) the 2nd day after surgery and the incisions simply need to be patted dry after the shower.  No additional care is needed. ° °What to call us about: You should call the office if you develop fever > 101 or develop persistent vomiting, redness or draining around your incision, or any other concerning symptoms.   ° °[facility] °[address], [address] °[place], [place] [address] °[phone] ° ° °Indwelling Urinary Catheter Care, Adult °Take good care of your catheter to keep it working and to prevent problems. °How to wear your catheter °Attach your catheter to your leg with tape (adhesive tape) or a leg strap. Make sure it is not too tight. If you use tape, remove any bits of tape that are already on the catheter. °How to wear a drainage  bag °You should have: °· A large overnight bag. °· A small leg bag. ° °Overnight Bag °You may wear the overnight bag at any time. Always keep the bag below the level of your bladder but off the floor. When you sleep, put a clean plastic bag in a wastebasket. Then hang the bag inside the wastebasket. °Leg Bag °Never wear the leg bag at night. Always wear the leg bag below your knee. Keep the leg bag secure with a leg strap or tape. °How to care for your skin °· Clean the skin around the catheter at least once every day. °· Shower every day. Do not take baths. °· Put creams, lotions, or ointments on your genital area only as told by your doctor. °· Do not use powders, sprays, or lotions on your genital area. °How to clean your catheter and your skin °1. Wash your hands with soap and water. °2. Wet a washcloth in warm water and gentle (mild) soap. °3. Use the washcloth to clean the skin where the catheter enters your body. Clean downward and wipe away from the catheter in small circles. Do not wipe toward the catheter. °4. Pat the area dry with a clean towel. Make sure to clean off all soap. °How to care for your drainage bags °Empty your drainage bag when it is ?-½ full or at least 2-3 times a day. Replace your drainage bag once a month or sooner if it starts to smell bad or look dirty. Do not clean your   drainage bag unless told by your doctor. °Emptying a drainage bag ° °Supplies Needed °· Rubbing alcohol. °· Gauze pad or cotton ball. °· Tape or a leg strap. ° °Steps °1. Wash your hands with soap and water. °2. Separate (detach) the bag from your leg. °3. Hold the bag over the toilet or a clean container. Keep the bag below your hips and bladder. This stops pee (urine) from going back into the tube. °4. Open the pour spout at the bottom of the bag. °5. Empty the pee into the toilet or container. Do not let the pour spout touch any surface. °6. Put rubbing alcohol on a gauze pad or cotton ball. °7. Use the gauze pad  or cotton ball to clean the pour spout. °8. Close the pour spout. °9. Attach the bag to your leg with tape or a leg strap. °10. Wash your hands. ° °Changing a drainage bag °Supplies Needed °· Alcohol wipes. °· A clean drainage bag. °· Adhesive tape or a leg strap. ° °Steps °1. Wash your hands with soap and water. °2. Separate the dirty bag from your leg. °3. Pinch the rubber catheter with your fingers so that pee does not spill out. °4. Separate the catheter tube from the drainage tube where these tubes connect (at the connection valve). Do not let the tubes touch any surface. °5. Clean the end of the catheter tube with an alcohol wipe. Use a different alcohol wipe to clean the end of the drainage tube. °6. Connect the catheter tube to the drainage tube of the clean bag. °7. Attach the new bag to the leg with adhesive tape or a leg strap. °8. Wash your hands. ° °How to prevent infection and other problems °· Never pull on your catheter or try to remove it. Pulling can damage tissue in your body. °· Always wash your hands before and after touching your catheter. °· If a leg strap gets wet, replace it with a dry one. °· Drink enough fluids to keep your pee clear or pale yellow, or as told by your doctor. °· Do not let the drainage bag or tubing touch the floor. °· Wear cotton underwear. °· If you are female, wipe from front to back after you poop (have a bowel movement). °· Check on the catheter often to make sure it works and the tubing is not twisted. °Get help if: °· Your pee is cloudy. °· Your pee smells unusually bad. °· Your pee is not draining into the bag. °· Your tube gets clogged. °· Your catheter starts to leak. °· Your bladder feels full. °Get help right away if: °· You have redness, swelling, or pain where the catheter enters your body. °· You have fluid, pus, or a bad smell coming from the area where the catheter enters your body. °· The area where the catheter enters your body feels warm. °· You have a  fever. °· You have pain in your: °? Stomach (abdomen). °? Legs. °? Lower back. °? Bladder. °· You see blood fill the catheter. °· Your pee is pink or red. °· You feel sick to your stomach (nauseous). °· You throw up (vomit). °· You have chills. °· Your catheter gets pulled out. °This information is not intended to replace advice given to you by your health care provider. Make sure you discuss any questions you have with your health care provider. °Document Released: 03/21/2013 Document Revised: 10/22/2016 Document Reviewed: 05/09/2014 °Elsevier Interactive Patient Education © 2018 [employer] ° °

## 2017-07-29 NOTE — Telephone Encounter (Signed)
-----   Message from Vanna Scotland, MD sent at 07/29/2017  9:08 AM EDT ----- Regarding: f/u plan This patient needs a voiding trial with a nurse in 1 week (Tuesday) and an appointment for cystoscopy, stent removal in 4 weeks with me.  Vanna Scotland, MD

## 2017-07-29 NOTE — Discharge Summary (Signed)
Date of admission: 07/27/2017  Date of discharge: 07/29/2017  Admission diagnosis: Left distal ureteral stricture  Discharge diagnosis: Same as above  Secondary diagnoses:  Patient Active Problem List   Diagnosis Date Noted  . Ureteral stricture, left 07/27/2017  . UTI (urinary tract infection) 05/01/2017  . Hydronephrosis concurrent with and due to ureteral stricture 05/01/2017  . Left nephrolithiasis 05/01/2017    History and Physical: For full details, please see admission history and physical. Briefly, Kathleen Hutchinson is a 31 y.o. 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   Hospital Course: Patient tolerated the procedure well.  She was then transferred to the floor after an uneventful PACU stay.  Her hospital course was uncomplicated.  On POD#2 she had met discharge criteria: was eating a regular diet, was up and ambulating independently,  pain was well controlled, JP removed, and was ready to for discharge.  She was taught how to manage her Foley catheter prior to discharge.  Physical Exam  Constitutional: She is oriented to person, place, and time. She appears well-developed.  HENT:  Head: Normocephalic and atraumatic.  Eyes: Pupils are equal, round, and reactive to light.  Cardiovascular: Normal rate.   Abdominal: Soft.  Lens clean dry and intact. JP with scant serosanguineous fluid.  Genitourinary:  Genitourinary Comments: Foley draining clear yellow urine.  Neurological: She is alert and oriented to person, place, and time.  Skin: Skin is warm and dry.  Psychiatric: She has a normal mood and affect.  Vitals reviewed.   Laboratory values:   Recent Labs  07/28/17 0351  HGB 12.5  HCT 36.1    Recent Labs  07/28/17 0351  NA 139  K 3.9  CL 109  CO2 25  GLUCOSE 118*  BUN 11  CREATININE 0.67  CALCIUM 8.4*   No results for input(s): LABPT, INR in the last 72  hours. No results for input(s): LABURIN in the last 72 hours. Results for orders placed or performed in visit on 07/15/17  CULTURE, URINE COMPREHENSIVE     Status: None   Collection Time: 07/15/17  1:48 PM  Result Value Ref Range Status   Urine Culture, Comprehensive Final report  Final   Organism ID, Bacteria Lactobacillus species  Final    Comment: 50,000-100,000 colony forming units per mL Susceptibility not normally performed on this organism.    Organism ID, Bacteria Comment  Final    Comment: Mixed urogenital flora 3,000 Colonies/mL   Microscopic Examination     Status: Abnormal   Collection Time: 07/15/17  1:48 PM  Result Value Ref Range Status   WBC, UA 0-5 0 - 5 /hpf Final   RBC, UA None seen 0 - 2 /hpf Final   Epithelial Cells (non renal) 0-10 0 - 10 /hpf Final   Bacteria, UA Few (A) None seen/Few Final    Disposition: Home  Discharge instruction: The patient was instructed to be ambulatory but told to refrain from heavy lifting, strenuous activity, or driving.   Discharge medications:  Allergies as of 07/29/2017      Reactions   Augmentin [amoxicillin-pot Clavulanate] Rash   Has patient had a PCN reaction causing immediate rash, facial/tongue/throat swelling, SOB or lightheadedness with hypotension: Yes Has patient had a PCN reaction causing severe rash involving mucus membranes or skin necrosis: Yes Has patient had a PCN reaction that required hospitalization: No Has patient had a PCN reaction occurring within the last 10  years: Yes If all of the above answers are "NO", then may proceed with Cephalosporin use.      Medication List    TAKE these medications   acetaminophen 325 MG tablet Commonly known as:  TYLENOL Take 650 mg by mouth every 6 (six) hours as needed for mild pain.   albuterol 108 (90 Base) MCG/ACT inhaler Commonly known as:  PROVENTIL HFA;VENTOLIN HFA Inhale into the lungs every 6 (six) hours as needed for wheezing or shortness of breath.    diphenhydrAMINE 25 MG tablet Commonly known as:  BENADRYL Take 25 mg by mouth daily as needed for allergies.   diphenhydramine-acetaminophen 25-500 MG Tabs tablet Commonly known as:  TYLENOL PM Take 2 tablets by mouth at bedtime as needed (sleep).   ibuprofen 200 MG tablet Commonly known as:  ADVIL,MOTRIN Take 200 mg by mouth every 6 (six) hours as needed.   multivitamin tablet Take 1 tablet by mouth daily.   naproxen 500 MG tablet Commonly known as:  NAPROSYN Take 500 mg by mouth as needed.   oxybutynin 5 MG tablet Commonly known as:  DITROPAN Take 5 mg by mouth 3 (three) times daily.   oxyCODONE-acetaminophen 5-325 MG tablet Commonly known as:  PERCOCET/ROXICET Take 1-2 tablets by mouth every 4 (four) hours as needed for moderate pain.   tamsulosin 0.4 MG Caps capsule Commonly known as:  FLOMAX Take 0.4 mg by mouth daily.            Discharge Care Instructions        Start     Ordered   07/28/17 0000  oxyCODONE-acetaminophen (PERCOCET/ROXICET) 5-325 MG tablet  Every 4 hours PRN     07/28/17 0903      Followup:  Follow-up Information    Hollice Espy, MD In 1 week.   Specialty:  Urology Why:  voiding trial Contact information: [address] [place] [address] [phone]

## 2017-07-29 NOTE — Progress Notes (Signed)
Discharge Instructions provided to patient including abdominal incision care, foley catheter care (leg bag for daytime use and foley bag for overnight use). Patient completed return demonstration for teach back related to leg bag and foley bag application. Patient verbalized understanding. ELQ

## 2017-07-29 NOTE — Telephone Encounter (Signed)
done

## 2017-07-29 NOTE — Progress Notes (Signed)
JP drain removed per MD order. 

## 2017-07-30 LAB — SURGICAL PATHOLOGY

## 2017-08-04 ENCOUNTER — Ambulatory Visit: Payer: BLUE CROSS/BLUE SHIELD

## 2017-08-05 ENCOUNTER — Ambulatory Visit (INDEPENDENT_AMBULATORY_CARE_PROVIDER_SITE_OTHER): Payer: BLUE CROSS/BLUE SHIELD

## 2017-08-05 VITALS — BP 118/84 | HR 96 | Ht 68.0 in | Wt 206.1 lb

## 2017-08-05 DIAGNOSIS — N135 Crossing vessel and stricture of ureter without hydronephrosis: Secondary | ICD-10-CM

## 2017-08-05 NOTE — Progress Notes (Signed)
Catheter Removal  Patient is present today for a catheter removal.  10ml of water was drained from the balloon. A 18FR foley cath was removed from the bladder no complications were noted . Patient tolerated well.  Preformed by: Rupert Stackshelsea Ellery Meroney, LPN   Follow up/ Additional notes: 56mo for cysto stent removal with Dr. Apolinar JunesBrandon.   Blood pressure 118/84, pulse 96, height 5\' 8"  (1.727 m), weight 206 lb 1.6 oz (93.5 kg).

## 2017-08-07 ENCOUNTER — Telehealth: Payer: Self-pay

## 2017-08-07 NOTE — Telephone Encounter (Signed)
Pt called stating after the foley was removed she started having spasms when she was trying to urinate. Reinforced with pt with a stent in place that can happen. Pt stated that she has oxybutynin and flomax but has not been taking it. Reinforced with pt to take both medications to help with the spasms and increase fluid intake. Pt voiced understanding.

## 2017-08-28 ENCOUNTER — Ambulatory Visit (INDEPENDENT_AMBULATORY_CARE_PROVIDER_SITE_OTHER): Payer: BLUE CROSS/BLUE SHIELD | Admitting: Urology

## 2017-08-28 VITALS — BP 117/82 | HR 80 | Ht 68.0 in | Wt 200.0 lb

## 2017-08-28 DIAGNOSIS — N135 Crossing vessel and stricture of ureter without hydronephrosis: Secondary | ICD-10-CM

## 2017-08-28 DIAGNOSIS — N1330 Unspecified hydronephrosis: Secondary | ICD-10-CM

## 2017-08-28 LAB — URINALYSIS, COMPLETE
BILIRUBIN UA: NEGATIVE
GLUCOSE, UA: NEGATIVE
KETONES UA: NEGATIVE
Nitrite, UA: NEGATIVE
Specific Gravity, UA: 1.015 (ref 1.005–1.030)
Urobilinogen, Ur: 0.2 mg/dL (ref 0.2–1.0)
pH, UA: 7 (ref 5.0–7.5)

## 2017-08-28 LAB — MICROSCOPIC EXAMINATION
RBC, UA: NONE SEEN /hpf (ref 0–?)
WBC, UA: >30 /hpf — ABNORMAL HIGH (ref 0–?)

## 2017-08-28 MED ORDER — CIPROFLOXACIN HCL 500 MG PO TABS
500.0000 mg | ORAL_TABLET | Freq: Once | ORAL | Status: AC
  Administered 2017-08-28: 500 mg via ORAL

## 2017-08-28 MED ORDER — LIDOCAINE HCL 2 % EX GEL
1.0000 "application " | Freq: Once | CUTANEOUS | Status: AC
  Administered 2017-08-28: 1 via URETHRAL

## 2017-08-28 NOTE — Progress Notes (Signed)
   08/28/17  CC: stent removal  HPI: 31 year old female with a history of severe left distal ureteral stricture and chronic left hydronephrosis who underwent right distal ureteral reimplant on 07/27/2017. She maintained a Foley catheter for a week postop. She returns today for cystoscopy, stent removal.  Overall, she's doing very well. Her incisions are well-healed. She does have some mild urinary symptoms from her stent but no dysuria, gross hematuria, or severe pain.   Blood pressure 117/82, pulse 80, height  (1.727 m), weight 200 lb (90.7 kg). NED. A&Ox3.   No respiratory distress   Abd soft, NT, ND.  Abdominal incisions healing well. Normal external genitalia with patent urethral meatus  Cystoscopy/ Stent removal procedure  Patient identification was confirmed, informed consent was obtained, and patient was prepped using Betadine solution.  Lidocaine jelly was administered per urethral meatus.    Preoperative abx where received prior to procedure.    Procedure: - Flexible cystoscope introduced, without any difficulty.   - Thorough search of the bladder revealed:    normal urethral meatus  Stent seen emanating from the dome of the bladder, grasped with stent graspers, and removed in entirety.    Post-Procedure: - Patient tolerated the procedure well   Assessment/ Plan:  1. Ureteral stricture, left S/p robotic reimplant Stent removed today without difficulty- warning symptoms reviewed Recommend imaging in 3 months in the form of a noncontrast CT scan to evaluate anatomy and assess for improvement of hydronephrosis - Urinalysis, Complete - lidocaine (XYLOCAINE) 2 % jelly 1 application; Place 1 application into the urethra once. - ciprofloxacin (CIPRO) tablet 500 mg; Take 1 tablet (500 mg total) by mouth once. - CT RENAL STONE STUDY; Future  2. 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   Vanna Scotland, MD

## 2017-09-07 ENCOUNTER — Telehealth: Payer: Self-pay

## 2017-09-07 ENCOUNTER — Ambulatory Visit: Payer: Self-pay

## 2017-09-07 NOTE — Telephone Encounter (Signed)
Pt called stating she had to stent taken out on 9/21 but now feels like she has a UTI. Pt described UTI s/s to be frequency, pressure, bladder spasms with urination and only urinating small amounts. Pt was added to nurse schedule for u/a and cx.

## 2017-11-27 ENCOUNTER — Ambulatory Visit: Payer: BLUE CROSS/BLUE SHIELD | Admitting: Urology

## 2017-12-02 ENCOUNTER — Inpatient Hospital Stay
Admission: EM | Admit: 2017-12-02 | Discharge: 2017-12-05 | DRG: 872 | Disposition: A | Discharge status: Home / Self Care | Payer: BLUE CROSS/BLUE SHIELD | Attending: Internal Medicine | Admitting: Internal Medicine

## 2017-12-02 ENCOUNTER — Encounter: Payer: Self-pay | Admitting: Emergency Medicine

## 2017-12-02 ENCOUNTER — Other Ambulatory Visit: Payer: Self-pay

## 2017-12-02 ENCOUNTER — Emergency Department: Payer: BLUE CROSS/BLUE SHIELD

## 2017-12-02 DIAGNOSIS — R109 Unspecified abdominal pain: Secondary | ICD-10-CM | POA: Diagnosis not present

## 2017-12-02 DIAGNOSIS — R10A0 Flank pain, unspecified side: Secondary | ICD-10-CM

## 2017-12-02 DIAGNOSIS — Z79899 Other long term (current) drug therapy: Secondary | ICD-10-CM | POA: Diagnosis not present

## 2017-12-02 DIAGNOSIS — R509 Fever, unspecified: Secondary | ICD-10-CM | POA: Diagnosis not present

## 2017-12-02 DIAGNOSIS — N200 Calculus of kidney: Secondary | ICD-10-CM

## 2017-12-02 DIAGNOSIS — N12 Tubulo-interstitial nephritis, not specified as acute or chronic: Secondary | ICD-10-CM | POA: Diagnosis not present

## 2017-12-02 DIAGNOSIS — N131 Hydronephrosis with ureteral stricture, not elsewhere classified: Secondary | ICD-10-CM | POA: Diagnosis present

## 2017-12-02 DIAGNOSIS — N135 Crossing vessel and stricture of ureter without hydronephrosis: Secondary | ICD-10-CM | POA: Diagnosis present

## 2017-12-02 DIAGNOSIS — Z880 Allergy status to penicillin: Secondary | ICD-10-CM | POA: Diagnosis not present

## 2017-12-02 DIAGNOSIS — E876 Hypokalemia: Secondary | ICD-10-CM | POA: Diagnosis present

## 2017-12-02 DIAGNOSIS — D72829 Elevated white blood cell count, unspecified: Secondary | ICD-10-CM | POA: Diagnosis not present

## 2017-12-02 DIAGNOSIS — F17210 Nicotine dependence, cigarettes, uncomplicated: Secondary | ICD-10-CM | POA: Diagnosis present

## 2017-12-02 DIAGNOSIS — N136 Pyonephrosis: Secondary | ICD-10-CM | POA: Diagnosis present

## 2017-12-02 DIAGNOSIS — N390 Urinary tract infection, site not specified: Secondary | ICD-10-CM

## 2017-12-02 DIAGNOSIS — A4151 Sepsis due to Escherichia coli [E. coli]: Secondary | ICD-10-CM | POA: Diagnosis not present

## 2017-12-02 DIAGNOSIS — A419 Sepsis, unspecified organism: Secondary | ICD-10-CM | POA: Diagnosis present

## 2017-12-02 DIAGNOSIS — N10 Acute tubulo-interstitial nephritis: Secondary | ICD-10-CM | POA: Diagnosis present

## 2017-12-02 DIAGNOSIS — E871 Hypo-osmolality and hyponatremia: Secondary | ICD-10-CM | POA: Diagnosis present

## 2017-12-02 LAB — COMPREHENSIVE METABOLIC PANEL
ALBUMIN: 3.9 g/dL (ref 3.5–5.0)
ALK PHOS: 72 U/L (ref 38–126)
ALT: 11 U/L — ABNORMAL LOW (ref 14–54)
ANION GAP: 11 (ref 5–15)
AST: 13 U/L — AB (ref 15–41)
BILIRUBIN TOTAL: 2 mg/dL — AB (ref 0.3–1.2)
BUN: 11 mg/dL (ref 6–20)
CALCIUM: 8.3 mg/dL — AB (ref 8.9–10.3)
CO2: 21 mmol/L — ABNORMAL LOW (ref 22–32)
Chloride: 101 mmol/L (ref 101–111)
Creatinine, Ser: 0.93 mg/dL (ref 0.44–1.00)
GFR calc Af Amer: >60 mL/min (ref >60)
GFR calc non Af Amer: >60 mL/min (ref >60)
GLUCOSE: 110 mg/dL — AB (ref 65–99)
Potassium: 3.1 mmol/L — ABNORMAL LOW (ref 3.5–5.1)
Sodium: 133 mmol/L — ABNORMAL LOW (ref 135–145)
TOTAL PROTEIN: 7.2 g/dL (ref 6.5–8.1)

## 2017-12-02 LAB — URINALYSIS, ROUTINE W REFLEX MICROSCOPIC
Bilirubin Urine: NEGATIVE
GLUCOSE, UA: NEGATIVE mg/dL
KETONES UR: 20 mg/dL — AB
NITRITE: POSITIVE — AB
PROTEIN: 100 mg/dL — AB
Specific Gravity, Urine: 1.012 (ref 1.005–1.030)
pH: 5 (ref 5.0–8.0)

## 2017-12-02 LAB — POCT PREGNANCY, URINE: Preg Test, Ur: NEGATIVE

## 2017-12-02 LAB — CBC
HEMATOCRIT: 43.5 % (ref 35.0–47.0)
HEMOGLOBIN: 14.6 g/dL (ref 12.0–16.0)
MCH: 29.7 pg (ref 26.0–34.0)
MCHC: 33.5 g/dL (ref 32.0–36.0)
MCV: 88.5 fL (ref 80.0–100.0)
Platelets: 204 10*3/uL (ref 150–440)
RBC: 4.91 MIL/uL (ref 3.80–5.20)
RDW: 14 % (ref 11.5–14.5)
WBC: 15 10*3/uL — ABNORMAL HIGH (ref 3.6–11.0)

## 2017-12-02 LAB — LIPASE, BLOOD: LIPASE: 26 U/L (ref 11–51)

## 2017-12-02 MED ORDER — MORPHINE SULFATE (PF) 4 MG/ML IV SOLN: 4.0000 mg | INTRAVENOUS | Status: DC | PRN

## 2017-12-02 MED ORDER — MORPHINE SULFATE (PF) 4 MG/ML IV SOLN
4.0000 mg | Freq: Once | INTRAVENOUS | Status: AC
  Administered 2017-12-02: 4 mg via INTRAVENOUS
  Filled 2017-12-02: qty 1

## 2017-12-02 MED ORDER — ONDANSETRON HCL 4 MG/2ML IJ SOLN
4.0000 mg | Freq: Once | INTRAMUSCULAR | Status: AC
  Administered 2017-12-02: 4 mg via INTRAVENOUS
  Filled 2017-12-02: qty 2

## 2017-12-02 MED ORDER — OXYCODONE HCL 5 MG PO TABS
5.0000 mg | ORAL_TABLET | ORAL | Status: DC | PRN
  Administered 2017-12-02 – 2017-12-05 (×9): 5 mg via ORAL
  Filled 2017-12-02 (×9): qty 1

## 2017-12-02 MED ORDER — LEVOFLOXACIN IN D5W 500 MG/100ML IV SOLN
500.0000 mg | Freq: Once | INTRAVENOUS | Status: AC
  Administered 2017-12-02: 500 mg via INTRAVENOUS
  Filled 2017-12-02: qty 100

## 2017-12-02 MED ORDER — SODIUM CHLORIDE 0.9 % IV SOLN
INTRAVENOUS | Status: AC
  Administered 2017-12-02: 23:00:00 via INTRAVENOUS

## 2017-12-02 MED ORDER — SODIUM CHLORIDE 0.9 % IV BOLUS (SEPSIS)
1000.0000 mL | Freq: Once | INTRAVENOUS | Status: AC
  Administered 2017-12-02: 1000 mL via INTRAVENOUS

## 2017-12-02 MED ORDER — LEVOFLOXACIN IN D5W 750 MG/150ML IV SOLN
750.0000 mg | INTRAVENOUS | Status: DC
  Administered 2017-12-03 – 2017-12-04 (×2): 750 mg via INTRAVENOUS
  Filled 2017-12-02 (×4): qty 150

## 2017-12-02 MED ORDER — ONDANSETRON HCL 4 MG PO TABS: 4.0000 mg | ORAL_TABLET | Freq: Four times a day (QID) | ORAL | Status: DC | PRN

## 2017-12-02 MED ORDER — HEPARIN SODIUM (PORCINE) 5000 UNIT/ML IJ SOLN
5000.0000 [IU] | Freq: Three times a day (TID) | INTRAMUSCULAR | Status: DC
  Administered 2017-12-03 – 2017-12-05 (×7): 5000 [IU] via SUBCUTANEOUS
  Filled 2017-12-02 (×7): qty 1

## 2017-12-02 MED ORDER — ONDANSETRON HCL 4 MG/2ML IJ SOLN: 4.0000 mg | Freq: Four times a day (QID) | INTRAMUSCULAR | Status: DC | PRN

## 2017-12-02 MED ORDER — ACETAMINOPHEN 325 MG PO TABS
650.0000 mg | ORAL_TABLET | Freq: Four times a day (QID) | ORAL | Status: DC | PRN
  Administered 2017-12-03 – 2017-12-05 (×4): 650 mg via ORAL
  Filled 2017-12-02 (×4): qty 2

## 2017-12-02 MED ORDER — ACETAMINOPHEN 650 MG RE SUPP: 650.0000 mg | Freq: Four times a day (QID) | RECTAL | Status: DC | PRN

## 2017-12-02 NOTE — H&P (Signed)
[facility] - [place] at [facility]   PATIENT NAME: Kathleen Hutchinson    MR#:  [id number]  DATE OF BIRTH:  01-Oct-1986  DATE OF ADMISSION:  12/02/2017  PRIMARY CARE PHYSICIAN: Patient, No Pcp Per   REQUESTING/REFERRING PHYSICIAN: Paduchowski, MD  CHIEF COMPLAINT:   Chief Complaint  Patient presents with  . Flank Pain    HISTORY OF PRESENT ILLNESS:  Kathleen Bockheresa Gayle Delgadillo  is a 31 y.o. female who presents with left flank pain with increasing malaise and nausea over the last several days.  She is found to meet sepsis criteria and on imaging likely has pyelonephritis.  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  Urology was called by the ED and recommended antibiotics and they will follow along as she does not have any definite obstructing stone at this time.  Hospitalist were called for admission  PAST MEDICAL HISTORY:   Past Medical History:  Diagnosis Date  . Headaches, cluster   . History of kidney stones   . Nephrolithiasis     PAST SURGICAL HISTORY:   Past Surgical History:  Procedure Laterality Date  . CESAREAN SECTION    . CYSTOSCOPY W/ RETROGRADES Left 05/01/2017   Procedure: CYSTOSCOPY WITH RETROGRADE PYELOGRAM;  Surgeon: Bjorn PippinWrenn, John, MD;  Location: [facility];  Service: Urology;  Laterality: Left;  . CYSTOSCOPY W/ RETROGRADES Left 07/06/2017   Procedure: CYSTOSCOPY WITH RETROGRADE PYELOGRAM;  Surgeon: Vanna ScotlandBrandon, Ashley, MD;  Location: [facility];  Service: Urology;  Laterality: Left;  . CYSTOSCOPY W/ URETERAL STENT PLACEMENT Left 07/06/2017   Procedure: CYSTOSCOPY WITH STENT REPLACEMENT;  Surgeon: Vanna ScotlandBrandon, Ashley, MD;  Location: [facility];  Service: Urology;  Laterality: Left;  . CYSTOSCOPY WITH STENT PLACEMENT Left 05/01/2017   Procedure: CYSTOSCOPY WITH STENT PLACEMENT;  Surgeon: Bjorn PippinWrenn, John, MD;  Location: [facility];  Service: Urology;  Laterality: Left;  . CYSTOSCOPY/URETEROSCOPY/HOLMIUM LASER/STENT  PLACEMENT Left 10/02/2016  . ROBOTICALLY ASSISTED LAPAROSCOPIC URETERAL RE-IMPLANTATION Left 07/27/2017   Procedure: ROBOTICALLY ASSISTED LAPAROSCOPIC URETERAL RE-IMPLANTATION;  Surgeon: Vanna ScotlandBrandon, Ashley, MD;  Location: [facility];  Service: Urology;  Laterality: Left;  . TUBAL LIGATION    . URETEROSCOPY WITH HOLMIUM LASER LITHOTRIPSY Left 07/06/2017   Procedure: URETEROSCOPY WITH HOLMIUM LASER LITHOTRIPSY;  Surgeon: Vanna ScotlandBrandon, Ashley, MD;  Location: [facility];  Service: Urology;  Laterality: Left;  Marland Kitchen. VAGINAL DELIVERY     x 4    SOCIAL HISTORY:   Social History   Tobacco Use  . Smoking status: Current Every Day Smoker    Packs/day: 0.25    Years: 10.00    Pack years: 2.50    Types: Cigarettes  . Smokeless tobacco: Never Used  Substance Use Topics  . Alcohol use: No    FAMILY HISTORY:   Family History  Problem Relation Age of Onset  . Cholecystitis Mother   . Nephrolithiasis Neg Hx   . Migraines Neg Hx     DRUG ALLERGIES:   Allergies  Allergen Reactions  . Augmentin [Amoxicillin-Pot Clavulanate] Rash    Has patient had a PCN reaction causing immediate rash, facial/tongue/throat swelling, SOB or lightheadedness with hypotension: Yes Has patient had a PCN reaction causing severe rash involving mucus membranes or skin necrosis: Yes Has patient had a PCN reaction that required hospitalization: No Has patient had a PCN reaction occurring within the last 10 years: Yes If all of the above answers are "NO", then may proceed with Cephalosporin use.     MEDICATIONS AT HOME:  Prior to Admission medications   Medication Sig Start Date End Date Taking? Authorizing Provider  acetaminophen (TYLENOL) 325 MG tablet Take 650 mg by mouth every 6 (six) hours as needed for mild pain.    Yes [provider]  albuterol (PROVENTIL HFA;VENTOLIN HFA) 108 (90 Base) MCG/ACT inhaler Inhale into the lungs every 6 (six) hours as needed for wheezing or shortness of breath.   Yes [provider]  diphenhydrAMINE (BENADRYL) 25 MG tablet Take 25 mg by mouth daily as needed for allergies.   Yes [provider]  diphenhydramine-acetaminophen (TYLENOL PM) 25-500 MG TABS tablet Take 2 tablets by mouth at bedtime as needed (sleep).   Yes [provider]  ibuprofen (ADVIL,MOTRIN) 200 MG tablet Take 200 mg by mouth every 6 (six) hours as needed.   Yes [provider]  Multiple Vitamin (MULTIVITAMIN) tablet Take 1 tablet by mouth daily.   Yes [provider]  oxybutynin (DITROPAN) 5 MG tablet Take 5 mg by mouth 3 (three) times daily.    [provider]  tamsulosin (FLOMAX) 0.4 MG CAPS capsule Take 0.4 mg by mouth daily.    [provider]    REVIEW OF SYSTEMS:  Review of Systems  Constitutional: Positive for chills and fever. Negative for malaise/fatigue and weight loss.  HENT: Negative for ear pain, hearing loss and tinnitus.   Eyes: Negative for blurred vision, double vision, pain and redness.  Respiratory: Negative for cough, hemoptysis and shortness of breath.   Cardiovascular: Negative for chest pain, palpitations, orthopnea and leg swelling.  Gastrointestinal: Negative for abdominal pain, constipation, diarrhea, nausea and vomiting.  Genitourinary: Positive for dysuria and flank pain. Negative for frequency and hematuria.  Musculoskeletal: Negative for back pain, joint pain and neck pain.  Skin:       No acne, rash, or lesions  Neurological: Negative for dizziness, tremors, focal weakness and weakness.  Endo/Heme/Allergies: Negative for polydipsia. Does not bruise/bleed easily.  Psychiatric/Behavioral: Negative for depression. The patient is not nervous/anxious and does not have insomnia.      VITAL SIGNS:   Vitals:   12/02/17 1720 12/02/17 1837 12/02/17 1838 12/02/17 1900  BP:  131/73  118/77  Pulse:   (!) 115 (!) 117  Resp:  (!) 22 11 (!) 25  Temp:      TempSrc:      SpO2:   98% 91%  Weight: 90.7 kg (200  lb)     Height: 5\' 8"  (1.727 m)      Wt Readings from Last 3 Encounters:  12/02/17 90.7 kg (200 lb)  08/28/17 90.7 kg (200 lb)  08/05/17 93.5 kg (206 lb 1.6 oz)    PHYSICAL EXAMINATION:  Physical Exam  Vitals reviewed. Constitutional: She is oriented to person, place, and time. She appears well-developed and well-nourished. No distress.  HENT:  Head: Normocephalic and atraumatic.  Mouth/Throat: Oropharynx is clear and moist.  Eyes: Conjunctivae and EOM are normal. Pupils are equal, round, and reactive to light. No scleral icterus.  Neck: Normal range of motion. Neck supple. No JVD present. No thyromegaly present.  Cardiovascular: Normal rate, regular rhythm and intact distal pulses. Exam reveals no gallop and no friction rub.  No murmur heard. Respiratory: Effort normal and breath sounds normal. No respiratory distress. She has no wheezes. She has no rales.  GI: Soft. Bowel sounds are normal. She exhibits no distension. There is tenderness.  Musculoskeletal: Normal range of motion. She exhibits no edema.  No arthritis, no gout  Lymphadenopathy:    She has no cervical adenopathy.  Neurological: She is alert and oriented to person, place, and time. No cranial nerve deficit.  No dysarthria, no aphasia  Skin: Skin is warm and dry. No rash noted. No erythema.  Psychiatric: She has a normal mood and affect. Her behavior is normal. Judgment and thought content normal.    LABORATORY PANEL:   CBC Recent Labs  Lab 12/02/17 1739  WBC 15.0*  HGB 14.6  HCT 43.5  PLT 204   ------------------------------------------------------------------------------------------------------------------  Chemistries  No results for input(s): NA, K, CL, CO2, GLUCOSE, BUN, CREATININE, CALCIUM, MG, AST, ALT, ALKPHOS, BILITOT in the last 168 hours.  Invalid input(s): GFRCGP ------------------------------------------------------------------------------------------------------------------  Cardiac  Enzymes No results for input(s): TROPONINI in the last 168 hours. ------------------------------------------------------------------------------------------------------------------  RADIOLOGY:  Ct Renal Stone Study  Result Date: 12/02/2017 CLINICAL DATA:  Left flank pain. EXAM: CT ABDOMEN AND PELVIS WITHOUT CONTRAST TECHNIQUE: Multidetector CT imaging of the abdomen and pelvis was performed following the standard protocol without IV contrast. COMPARISON:  05/01/2017 FINDINGS: Lower chest: No acute abnormality. Hepatobiliary: No focal liver abnormality is seen. No gallstones, gallbladder wall thickening, or biliary dilatation. Pancreas: Unremarkable. No pancreatic ductal dilatation or surrounding inflammatory changes. Spleen: Normal in size without focal abnormality. Adrenals/Urinary Tract: Normal bilateral adrenal glands. Normal right kidney, right ureter and urinary bladder. Marked left hydronephrosis with perinephric fat stranding. The left ureter has irregular contour and beaded appearance, and demonstrates mild dilation to the level of the pelvis. The distal most left ureter is not well visualized. No radiopaque ureteral calculus is seen. There are 3 nonobstructive left renal calculi, the largest in the lower pole of the kidney measuring 6 mm. Stomach/Bowel: Stomach is within normal limits. Appendix appears normal. No evidence of bowel wall thickening, distention, or inflammatory changes. Vascular/Lymphatic: No significant vascular findings are present. No enlarged abdominal or pelvic lymph nodes. Reproductive: Uterus and bilateral adnexa are unremarkable. 3.6 cm left ovarian cyst, likely physiologic. Other: No abdominal wall hernia or abnormality. Small amount of free fluid in the pelvis. Musculoskeletal: No acute or significant osseous findings. IMPRESSION: Markedly abnormal appearance of the left kidney with moderate to marked hydronephrosis and perinephric stranding. Milder left hydroureter to the  level of the left ovary, with abnormal beaded irregular appearance of the left ureter. The distal most left ureter is not seen. Three nonobstructive left renal calculi, measuring up to 6 mm. 3.6 cm left ovarian cyst, probably physiologic. These results were called by telephone at the time of interpretation on 12/02/2017 at 6:46 pm to Dr. Minna AntisKEVIN PADUCHOWSKI , who verbally acknowledged these results. Electronically Signed   By: Ted Mcalpineobrinka  Dimitrova M.D.   On: 12/02/2017 18:50    EKG:  No orders found for this or any previous visit.  IMPRESSION AND PLAN:  Principal Problem:   Sepsis (HCC) -lactic acid was within normal limits, blood pressure stable, IV antibiotics in place, cultures sent Active Problems:   Acute pyelonephritis -IV antibiotics as above, urology consult   Left nephrolithiasis -urology consult as above, likely nothing to do for this at this time   Ureteral stricture, left -urology consult, patient might need nephrostomy tube depending on how she fares, will follow their recommendations  All the records are reviewed and case discussed with ED provider. Management plans discussed with the patient and/or family.  DVT PROPHYLAXIS: SubQ heparin  GI PROPHYLAXIS: None  ADMISSION STATUS: Inpatient  CODE STATUS: Full Code Status History    Date Active Date Inactive Code Status  Order ID Comments User Context   07/27/2017 16:33 07/29/2017 19:11 Full Code [id number]  Vanna Scotland, MD Inpatient   05/01/2017 18:24 05/02/2017 15:16 Full Code [id number]  Milagros Loll, MD ED      TOTAL TIME TAKING CARE OF THIS PATIENT: 45 minutes.   Jadence Kinlaw FIELDING 12/02/2017, 8:46 PM  [facility]  Office  [phone]  CC: Primary care physician; Patient, No Pcp Per  Note:  This document was prepared using Dragon voice recognition software and may include unintentional dictation errors.

## 2017-12-02 NOTE — ED Triage Notes (Signed)
Patient presents to ED via [facility] from home with c/o left flank pain. Patient reports hx of kidney infections. Patient rocking back and forth in triage. Patient states "I really just want to sleep. I can't sleep sitting up in a chair out here".

## 2017-12-02 NOTE — ED Provider Notes (Signed)
I assumed care of the patient from Dr. Lenard LancePaduchowski and spoke with Dr. Griffin DakinWinship urologist on call who agreed the plan for admission for IV antibiotics.   Darci CurrentBrown, [place] N, MD 12/02/17 2128

## 2017-12-02 NOTE — ED Notes (Signed)
Medications and blood delayed waiting IV team consult. MD was aware

## 2017-12-02 NOTE — ED Provider Notes (Addendum)
[facility] Emergency Department Provider Note  Time seen: 5:34 PM  I have reviewed the triage vital signs and the nursing notes.   HISTORY  Chief Complaint Flank Pain    HPI Kathleen Hutchinson is a 31 y.o. female with a past medical history of kidney stones, ureteral surgery in August per patient, headache, presents to the emergency department for left flank pain.  According to the patient for the past several days she has been experiencing left flank pain, states some pain/spasm type pain when she urinates, but denies burning or dysuria.  Denies fever.  States some nausea with intermittent vomiting.  Denies hematuria.  Denies vaginal bleeding or discharge.  Describes her pain as moderate to severe.  Also states a moderate headache.  Otherwise negative review of systems.   Past Medical History:  Diagnosis Date  . Headaches, cluster   . History of kidney stones   . Nephrolithiasis     Patient Active Problem List   Diagnosis Date Noted  . Ureteral stricture, left 07/27/2017  . UTI (urinary tract infection) 05/01/2017  . Hydronephrosis concurrent with and due to ureteral stricture 05/01/2017  . Left nephrolithiasis 05/01/2017    Past Surgical History:  Procedure Laterality Date  . CESAREAN SECTION    . CYSTOSCOPY W/ RETROGRADES Left 05/01/2017   Procedure: CYSTOSCOPY WITH RETROGRADE PYELOGRAM;  Surgeon: Bjorn Pippin, MD;  Location: [facility];  Service: Urology;  Laterality: Left;  . CYSTOSCOPY W/ RETROGRADES Left 07/06/2017   Procedure: CYSTOSCOPY WITH RETROGRADE PYELOGRAM;  Surgeon: Vanna Scotland, MD;  Location: [facility];  Service: Urology;  Laterality: Left;  . CYSTOSCOPY W/ URETERAL STENT PLACEMENT Left 07/06/2017   Procedure: CYSTOSCOPY WITH STENT REPLACEMENT;  Surgeon: Vanna Scotland, MD;  Location: [facility];  Service: Urology;  Laterality: Left;  . CYSTOSCOPY WITH STENT PLACEMENT Left 05/01/2017   Procedure: CYSTOSCOPY WITH STENT PLACEMENT;  Surgeon:  Bjorn Pippin, MD;  Location: [facility];  Service: Urology;  Laterality: Left;  . CYSTOSCOPY/URETEROSCOPY/HOLMIUM LASER/STENT PLACEMENT Left 10/02/2016  . ROBOTICALLY ASSISTED LAPAROSCOPIC URETERAL RE-IMPLANTATION Left 07/27/2017   Procedure: ROBOTICALLY ASSISTED LAPAROSCOPIC URETERAL RE-IMPLANTATION;  Surgeon: Vanna Scotland, MD;  Location: [facility];  Service: Urology;  Laterality: Left;  . TUBAL LIGATION    . URETEROSCOPY WITH HOLMIUM LASER LITHOTRIPSY Left 07/06/2017   Procedure: URETEROSCOPY WITH HOLMIUM LASER LITHOTRIPSY;  Surgeon: Vanna Scotland, MD;  Location: [facility];  Service: Urology;  Laterality: Left;  Marland Kitchen VAGINAL DELIVERY     x 4    Prior to Admission medications   Medication Sig Start Date End Date Taking? Authorizing Provider  acetaminophen (TYLENOL) 325 MG tablet Take 650 mg by mouth every 6 (six) hours as needed for mild pain.     [provider]  albuterol (PROVENTIL HFA;VENTOLIN HFA) 108 (90 Base) MCG/ACT inhaler Inhale into the lungs every 6 (six) hours as needed for wheezing or shortness of breath.    [provider]  diphenhydrAMINE (BENADRYL) 25 MG tablet Take 25 mg by mouth daily as needed for allergies.    [provider]  diphenhydramine-acetaminophen (TYLENOL PM) 25-500 MG TABS tablet Take 2 tablets by mouth at bedtime as needed (sleep).    [provider]  ibuprofen (ADVIL,MOTRIN) 200 MG tablet Take 200 mg by mouth every 6 (six) hours as needed.    [provider]  Multiple Vitamin (MULTIVITAMIN) tablet Take 1 tablet by mouth daily.    [provider]  oxybutynin (DITROPAN) 5 MG tablet Take 5 mg by mouth  3 (three) times daily.    [provider]  tamsulosin (FLOMAX) 0.4 MG CAPS capsule Take 0.4 mg by mouth daily.    [provider]    Allergies  Allergen Reactions  . Augmentin [Amoxicillin-Pot Clavulanate] Rash    Has patient had a PCN reaction causing immediate rash, facial/tongue/throat  swelling, SOB or lightheadedness with hypotension: Yes Has patient had a PCN reaction causing severe rash involving mucus membranes or skin necrosis: Yes Has patient had a PCN reaction that required hospitalization: No Has patient had a PCN reaction occurring within the last 10 years: Yes If all of the above answers are "NO", then may proceed with Cephalosporin use.     Family History  Problem Relation Age of Onset  . Cholecystitis Mother   . Nephrolithiasis Neg Hx   . Migraines Neg Hx     Social History Social History   Tobacco Use  . Smoking status: Current Every Day Smoker    Packs/day: 0.25    Years: 10.00    Pack years: 2.50    Types: Cigarettes  . Smokeless tobacco: Never Used  Substance Use Topics  . Alcohol use: No  . Drug use: Yes    Types: Marijuana    Comment: EVERYDAY    Review of Systems Constitutional: Negative for fever. Eyes: Negative for visual changes. ENT: Negative for congestion Cardiovascular: Negative for chest pain. Respiratory: Negative for shortness of breath. Gastrointestinal: Positive for left-sided abdominal pain.  Positive for nausea vomiting.  Negative for diarrhea Genitourinary: Negative for dysuria.  But states bladder spasm-like pain when she urinates. Musculoskeletal: Negative for back pain. Skin: Negative for rash. Neurological: Moderate headache. All other ROS negative  ____________________________________________   PHYSICAL EXAM:  VITAL SIGNS: ED Triage Vitals  Enc Vitals Group     BP 12/02/17 1719 94/80     Pulse Rate 12/02/17 1719 (!) 128     Resp 12/02/17 1719 20     Temp 12/02/17 1719 99.2 F (37.3 C)     Temp Source 12/02/17 1719 Oral     SpO2 12/02/17 1719 99 %     Weight 12/02/17 1720 200 lb (90.7 kg)     Height 12/02/17 1720 5\' 8"  (1.727 m)     Head Circumference --      Peak Flow --      Pain Score 12/02/17 1719 10     Pain Loc --      Pain Edu? --      Excl. in GC? --     Constitutional: Alert and  oriented.  Mild distress due to abdominal pain holding her left abdomen. Eyes: Normal exam ENT   Head: Normocephalic and atraumatic.   Mouth/Throat: Mucous membranes are moist. Cardiovascular: Normal rate, regular rhythm. No murmur Respiratory: Normal respiratory effort without tachypnea nor retractions. Breath sounds are clear  Gastrointestinal: Soft, mild left upper and lower quadrant tenderness to palpation with mild suprapubic tenderness, moderate left CVA tenderness.  No rebound or guarding.  No distention. Musculoskeletal: Nontender with normal range of motion in all extremities. Neurologic:  Normal speech and language. No gross focal neurologic deficits  Skin:  Skin is warm, dry and intact.  Psychiatric: Mood and affect are normal.   ____________________________________________    RADIOLOGY  IMPRESSION: Markedly abnormal appearance of the left kidney with moderate to marked hydronephrosis and perinephric stranding. Milder left hydroureter to the level of the left ovary, with abnormal beaded irregular appearance of the left ureter. The distal most left ureter  is not seen.  Three nonobstructive left renal calculi, measuring up to 6 mm.  3.6 cm left ovarian cyst, probably physiologic.  ____________________________________________   INITIAL IMPRESSION / ASSESSMENT AND PLAN / ED COURSE  Pertinent labs & imaging results that were available during my care of the patient were reviewed by me and considered in my medical decision making (see chart for details).  Patient presents to the emergency department for left flank pain for the past few days.  Differential would include UTI, pyelonephritis, ureterolithiasis, colitis or diverticulitis or other intra-abdominal pathology.  Will obtain labs including urinalysis and urine culture.  We will obtain a CT renal scan to further evaluate.  We will treat pain and nausea as well as IV hydrate while awaiting results.  Patient  agreeable to this plan of care.  I have reviewed the patient's records, and August the patient had a left ureteral reimplantation by Dr. Apolinar JunesBrandon.  Discussed the patient with urology they will be in to see the patient.  Likely obstructing left ureter, but no definitive stones seen.  Patient has a urinary tract infection we will start on IV Levaquin.  Urine culture has been sent.  Urology will be in to see the patient in the emergency department.  Patient will be admitted to the hospitalist service for antibiotics and pain control with urology consultation.  Patient's chemistry is pending.  Patient care signed out to Dr. Manson PasseyBrown.  Discussed the patient with Dr. Katheren ShamsSalary, Will admit once chemistry has resulted.    ____________________________________________   FINAL CLINICAL IMPRESSION(S) / ED DIAGNOSES  Left flank pain Urinary tract infection Ureteral obstruction   Minna AntisPaduchowski, Jesalyn Finazzo, MD 12/02/17 Manon Hilding1914    Minna AntisPaduchowski, Christo Hain, MD 12/02/17 [id number]

## 2017-12-02 NOTE — ED Notes (Signed)
Multiple attempts at IV insertion by this RN and Kennyth LoseJane R., RN.

## 2017-12-02 NOTE — Progress Notes (Signed)
Pharmacy Antibiotic Note  Kathleen Hutchinson is a 31 y.o. female admitted on 12/02/2017 with UTI.  Pharmacy has been consulted for Levaquin dosing.  Plan: Levaquin 750 mg q 24 hours ordered.  Height: 5\' 8"  (172.7 cm) Weight: 211 lb 9.6 oz (96 kg) IBW/kg (Calculated) : 63.9  Temp (24hrs), Avg:98.8 F (37.1 C), Min:98.3 F (36.8 C), Max:99.2 F (37.3 C)  Recent Labs  Lab 12/02/17 1739 12/02/17 2037  WBC 15.0*  --   CREATININE  --  0.93    Estimated Creatinine Clearance: 104.2 mL/min (by C-G formula based on SCr of 0.93 mg/dL).    Allergies  Allergen Reactions  . Augmentin [Amoxicillin-Pot Clavulanate] Rash    Has patient had a PCN reaction causing immediate rash, facial/tongue/throat swelling, SOB or lightheadedness with hypotension: Yes Has patient had a PCN reaction causing severe rash involving mucus membranes or skin necrosis: Yes Has patient had a PCN reaction that required hospitalization: No Has patient had a PCN reaction occurring within the last 10 years: Yes If all of the above answers are "NO", then may proceed with Cephalosporin use.     Antimicrobials this admission: Levofloxacin 12/26  >>    >>   Dose adjustments this admission:   Microbiology results: 12/26 UCx: pending       15/26 UA: LE(+) NO2(+) WBC TNTC Thank you for allowing pharmacy to be a part of this patient's care.  Mayetta Castleman S 12/02/2017 11:16 PM

## 2017-12-02 NOTE — ED Notes (Signed)
Patient is resting comfortably at this time with no signs of distress present. VS stable. Will continue to monitor.   

## 2017-12-02 NOTE — ED Notes (Signed)
Admitting MD at bedside.

## 2017-12-02 NOTE — Consult Note (Signed)
UROLOGY CONSULT NOTE  Service Requesting Consultation: Emergency med  Service Providing Consultation:Urology Author: Jackelyn KnifeBrenton Dela Sweeny, MD 12/02/2017 8:17 PM SUBJECTIVE:  REASON FOR CONSULTATION:  Left flank pain, history of ureteral reimplantation  HPI: Kathleen Hutchinson is a 31 y.o. old female with a history of recurrent nephrolithiasis, left hydronephrosis, left distal ureteral stricture s/p left ureteral reimplant about 3 months ago by Dr. Apolinar JunesBrandon. She states that she started having bladder spasms and left flank pain about 3 days ago. Her symptoms have not improved and she has noted chills and temps at home to 99.104F. She denies dysuria, but states she has never had painful urination with infections in the past. She has urinary frequency at baseline. She denies hematuria. She does note some return of occasional left flank pain over the last month or so, similar to symptoms present prior to her ureteral reimplant.    PMH: Past Medical History:  Diagnosis Date  . Headaches, cluster   . History of kidney stones   . Nephrolithiasis   :  PSH: Past Surgical History:  Procedure Laterality Date  . CESAREAN SECTION    . CYSTOSCOPY W/ RETROGRADES Left 05/01/2017   Procedure: CYSTOSCOPY WITH RETROGRADE PYELOGRAM;  Surgeon: Bjorn PippinWrenn, John, MD;  Location: [facility];  Service: Urology;  Laterality: Left;  . CYSTOSCOPY W/ RETROGRADES Left 07/06/2017   Procedure: CYSTOSCOPY WITH RETROGRADE PYELOGRAM;  Surgeon: Vanna ScotlandBrandon, Ashley, MD;  Location: [facility];  Service: Urology;  Laterality: Left;  . CYSTOSCOPY W/ URETERAL STENT PLACEMENT Left 07/06/2017   Procedure: CYSTOSCOPY WITH STENT REPLACEMENT;  Surgeon: Vanna ScotlandBrandon, Ashley, MD;  Location: [facility];  Service: Urology;  Laterality: Left;  . CYSTOSCOPY WITH STENT PLACEMENT Left 05/01/2017   Procedure: CYSTOSCOPY WITH STENT PLACEMENT;  Surgeon: Bjorn PippinWrenn, John, MD;  Location: [facility];  Service: Urology;  Laterality: Left;  . CYSTOSCOPY/URETEROSCOPY/HOLMIUM  LASER/STENT PLACEMENT Left 10/02/2016  . ROBOTICALLY ASSISTED LAPAROSCOPIC URETERAL RE-IMPLANTATION Left 07/27/2017   Procedure: ROBOTICALLY ASSISTED LAPAROSCOPIC URETERAL RE-IMPLANTATION;  Surgeon: Vanna ScotlandBrandon, Ashley, MD;  Location: [facility];  Service: Urology;  Laterality: Left;  . TUBAL LIGATION    . URETEROSCOPY WITH HOLMIUM LASER LITHOTRIPSY Left 07/06/2017   Procedure: URETEROSCOPY WITH HOLMIUM LASER LITHOTRIPSY;  Surgeon: Vanna ScotlandBrandon, Ashley, MD;  Location: [facility];  Service: Urology;  Laterality: Left;  Marland Kitchen. VAGINAL DELIVERY     x 4  :  FAMILY HISTORY: Family History  Problem Relation Age of Onset  . Cholecystitis Mother   . Nephrolithiasis Neg Hx   . Migraines Neg Hx   :   SOCIAL HISTORY: Social History   Tobacco Use  . Smoking status: Current Every Day Smoker    Packs/day: 0.25    Years: 10.00    Pack years: 2.50    Types: Cigarettes  . Smokeless tobacco: Never Used  Substance Use Topics  . Alcohol use: No  . Drug use: Yes    Types: Marijuana    Comment: EVERYDAY  .    ALLERGIES: Allergies  Allergen Reactions  . Augmentin [Amoxicillin-Pot Clavulanate] Rash    Has patient had a PCN reaction causing immediate rash, facial/tongue/throat swelling, SOB or lightheadedness with hypotension: Yes Has patient had a PCN reaction causing severe rash involving mucus membranes or skin necrosis: Yes Has patient had a PCN reaction that required hospitalization: No Has patient had a PCN reaction occurring within the last 10 years: Yes If all of the above answers are "NO", then may proceed with Cephalosporin use.   :  MEDICATIONS: No current facility-administered medications on file  prior to encounter.    Current Outpatient Medications on File Prior to Encounter  Medication Sig Dispense Refill  . acetaminophen (TYLENOL) 325 MG tablet Take 650 mg by mouth every 6 (six) hours as needed for mild pain.     Marland Kitchen. albuterol (PROVENTIL HFA;VENTOLIN HFA) 108 (90 Base) MCG/ACT inhaler Inhale  into the lungs every 6 (six) hours as needed for wheezing or shortness of breath.    . diphenhydrAMINE (BENADRYL) 25 MG tablet Take 25 mg by mouth daily as needed for allergies.    . diphenhydramine-acetaminophen (TYLENOL PM) 25-500 MG TABS tablet Take 2 tablets by mouth at bedtime as needed (sleep).    Marland Kitchen. ibuprofen (ADVIL,MOTRIN) 200 MG tablet Take 200 mg by mouth every 6 (six) hours as needed.    . Multiple Vitamin (MULTIVITAMIN) tablet Take 1 tablet by mouth daily.    Marland Kitchen. oxybutynin (DITROPAN) 5 MG tablet Take 5 mg by mouth 3 (three) times daily.    . tamsulosin (FLOMAX) 0.4 MG CAPS capsule Take 0.4 mg by mouth daily.    :  OBJECTIVE:   REVIEW OF SYSTEMS:  Review of Symptoms: A 10-point review of system was performed and was negative other than as noted in the HPI  PHYSICAL EXAM:  Temp:  [99.2 F (37.3 C)] 99.2 F (37.3 C) (12/26 1719) Pulse Rate:  [115-128] 117 (12/26 1900) Resp:  [11-25] 25 (12/26 1900) BP: (94-131)/(73-80) 118/77 (12/26 1900) SpO2:  [91 %-99 %] 91 % (12/26 1900) Weight:  [90.7 kg (200 lb)] 90.7 kg (200 lb) (12/26 1720) GENERAL ASSESSMENT: oriented to person, place, and time and in mild to moderate distress NEURO: alert, oriented, no focal deficits or movement disorder noted. appropriate affect SKIN EXAM: no rashes or lesions  HEENT: Atraumatic, normocephalic CV: Regular rhythm, tachycardic to 100s CHEST: equal expansion bilaterally, unlabored breathing without accessory muscle use. ABDOMEN: Abdomen is soft without significant tenderness, masses, organomegaly or guarding. BACK: Left CVA tenderness EXTREMITIES: Normal muscle bulk and tone. All joints with full range of motion. No deformity or tenderness.  LABS: Lab Results  Component Value Date   WBC 15.0 (H) 12/02/2017   HGB 14.6 12/02/2017   HCT 43.5 12/02/2017   MCV 88.5 12/02/2017   PLT 204 12/02/2017   Urinalysis    Component Value Date/Time   COLORURINE YELLOW (A) 12/02/2017 1739   APPEARANCEUR  CLOUDY (A) 12/02/2017 1739   APPEARANCEUR Cloudy (A) 08/28/2017 1011   LABSPEC 1.012 12/02/2017 1739   PHURINE 5.0 12/02/2017 1739   GLUCOSEU NEGATIVE 12/02/2017 1739   HGBUR MODERATE (A) 12/02/2017 1739   BILIRUBINUR NEGATIVE 12/02/2017 1739   BILIRUBINUR Negative 08/28/2017 1011   KETONESUR 20 (A) 12/02/2017 1739   PROTEINUR 100 (A) 12/02/2017 1739   NITRITE POSITIVE (A) 12/02/2017 1739   LEUKOCYTESUR LARGE (A) 12/02/2017 1739   LEUKOCYTESUR 3+ (A) 08/28/2017 1011    IMAGING: CT A/P 12/02/17 FINDINGS: Lower chest: No acute abnormality.  Hepatobiliary: No focal liver abnormality is seen. No gallstones, gallbladder wall thickening, or biliary dilatation.  Pancreas: Unremarkable. No pancreatic ductal dilatation or surrounding inflammatory changes.  Spleen: Normal in size without focal abnormality.  Adrenals/Urinary Tract: Normal bilateral adrenal glands. Normal right kidney, right ureter and urinary bladder. Marked left hydronephrosis with perinephric fat stranding. The left ureter has irregular contour and beaded appearance, and demonstrates mild dilation to the level of the pelvis. The distal most left ureter is not well visualized. No radiopaque ureteral calculus is seen. There are 3 nonobstructive left renal calculi, the largest  in the lower pole of the kidney measuring 6 mm.  Stomach/Bowel: Stomach is within normal limits. Appendix appears normal. No evidence of bowel wall thickening, distention, or inflammatory changes.  Vascular/Lymphatic: No significant vascular findings are present. No enlarged abdominal or pelvic lymph nodes.  Reproductive: Uterus and bilateral adnexa are unremarkable. 3.6 cm left ovarian cyst, likely physiologic.  Other: No abdominal wall hernia or abnormality. Small amount of free fluid in the pelvis.  Musculoskeletal: No acute or significant osseous findings.  IMPRESSION: Markedly abnormal appearance of the left kidney with  moderate to marked hydronephrosis and perinephric stranding. Milder left hydroureter to the level of the left ovary, with abnormal beaded irregular appearance of the left ureter. The distal most left ureter is not seen.  Three nonobstructive left renal calculi, measuring up to 6 mm.  3.6 cm left ovarian cyst, probably physiologic.  ASSESSMENT:   31 y.o. old female with complex urologic history including recurrent nephrolithiasis, left distal ureteral stricture, s/p left ureteral reimplant in August 2018 now with signs and symptoms concerning for pyelonephritis with possible recurrent obstruction.   -Clinical picture is consistent with infection-related edema of the ureter and resultant partial obstruction OR recurrent stricture formation with superimposed infection.  -Imaging does not illustrate etiology well: given her chronic hydronephrosis prior to reimplantation, the degree of improvement post-op is difficult to guess  -She may require drainage due to her infection and obstruction picture. Given her recent reimplantation and possible stricture reformation, ureteral stent placement is a poor option as the odds of success in the event of stricture are extremely low. Percutaneous nephrostomy tube is indicated should she fail to quickly improve with antibiotics and supportive care or should her clinical picture worsen. She does not currently appear so ill as to require emergent placement.  -Discussed with Kathleen Hutchinson that she may require drainage of her left sided urinary system should she not quickly clinically improve with IV antibiotics and supportive care or should she start to worsen. She understands a nephrostomy tube would be first line in such a situation given her reimplant.   PLAN:  - Admission to hospital service for close observation - Broad spectrum antibiotics for presumed pyelonephritis - Send urine for culture - Send blood for culture if febrile - Supportive care for pain  control. Can consider NSAIDs pending her renal function - Should her clinical picture worsen, as discussed above, she will require a left nephrostomy tube placed by interventional radiology.  - NPO after midnight in case of need for procedure tomorrow - Will continue to follow and update recommendations based on her overnight course and discussion with Dr. Apolinar Junes - Please call with questions or concerns

## 2017-12-02 NOTE — ED Notes (Signed)
Pt up to toilet in treatment room without complication.

## 2017-12-02 NOTE — ED Notes (Signed)
Recollect needed on light green top when IV obtained.

## 2017-12-03 DIAGNOSIS — N10 Acute tubulo-interstitial nephritis: Secondary | ICD-10-CM

## 2017-12-03 LAB — BASIC METABOLIC PANEL
Anion gap: 13 (ref 5–15)
BUN: 11 mg/dL (ref 6–20)
CALCIUM: 7.8 mg/dL — AB (ref 8.9–10.3)
CHLORIDE: 102 mmol/L (ref 101–111)
CO2: 19 mmol/L — AB (ref 22–32)
CREATININE: 0.91 mg/dL (ref 0.44–1.00)
GFR calc Af Amer: >60 mL/min (ref >60)
GFR calc non Af Amer: >60 mL/min (ref >60)
GLUCOSE: 103 mg/dL — AB (ref 65–99)
Potassium: 2.9 mmol/L — ABNORMAL LOW (ref 3.5–5.1)
Sodium: 134 mmol/L — ABNORMAL LOW (ref 135–145)

## 2017-12-03 LAB — CBC
HCT: 36.7 % (ref 35.0–47.0)
HEMOGLOBIN: 12.3 g/dL (ref 12.0–16.0)
MCH: 29.5 pg (ref 26.0–34.0)
MCHC: 33.5 g/dL (ref 32.0–36.0)
MCV: 88 fL (ref 80.0–100.0)
PLATELETS: 189 10*3/uL (ref 150–440)
RBC: 4.17 MIL/uL (ref 3.80–5.20)
RDW: 13.7 % (ref 11.5–14.5)
WBC: 13.1 10*3/uL — ABNORMAL HIGH (ref 3.6–11.0)

## 2017-12-03 LAB — MAGNESIUM: MAGNESIUM: 1.3 mg/dL — AB (ref 1.7–2.4)

## 2017-12-03 MED ORDER — POTASSIUM CHLORIDE CRYS ER 20 MEQ PO TBCR
40.0000 meq | EXTENDED_RELEASE_TABLET | ORAL | Status: AC
  Administered 2017-12-03 (×2): 40 meq via ORAL
  Filled 2017-12-03 (×2): qty 2

## 2017-12-03 MED ORDER — SODIUM CHLORIDE 0.9 % IV SOLN
INTRAVENOUS | Status: AC
  Administered 2017-12-03: 13:00:00 via INTRAVENOUS

## 2017-12-03 MED ORDER — MAGNESIUM SULFATE 4 GM/100ML IV SOLN
4.0000 g | Freq: Once | INTRAVENOUS | Status: AC
  Administered 2017-12-03: 4 g via INTRAVENOUS
  Filled 2017-12-03: qty 100

## 2017-12-03 NOTE — Progress Notes (Signed)
[employer] [facility] [facility] [place] [facility]   PATIENT NAME: Kathleen Hutchinson    MR#:  [id number]  DATE OF BIRTH:  1986/04/06  SUBJECTIVE:  CHIEF COMPLAINT:   Chief Complaint  Patient presents with  . Flank Pain   The patient feels better. REVIEW OF SYSTEMS:  Review of Systems  Constitutional: Negative for chills, fever and malaise/fatigue.  HENT: Negative for sore throat.   Eyes: Negative for blurred vision and double vision.  Respiratory: Negative for cough, hemoptysis, shortness of breath, wheezing and stridor.   Cardiovascular: Negative for chest pain, palpitations, orthopnea and leg swelling.  Gastrointestinal: Negative for abdominal pain, blood in stool, diarrhea, melena, nausea and vomiting.  Genitourinary: Negative for dysuria, flank pain and hematuria.  Musculoskeletal: Negative for back pain and joint pain.  Skin: Negative for rash.  Neurological: Negative for dizziness, sensory change, focal weakness, seizures, loss of consciousness, weakness and headaches.  Endo/Heme/Allergies: Negative for polydipsia.  Psychiatric/Behavioral: Negative for depression. The patient is not nervous/anxious.     DRUG ALLERGIES:   Allergies  Allergen Reactions  . Augmentin [Amoxicillin-Pot Clavulanate] Rash    Has patient had a PCN reaction causing immediate rash, facial/tongue/throat swelling, SOB or lightheadedness with hypotension: Yes Has patient had a PCN reaction causing severe rash involving mucus membranes or skin necrosis: Yes Has patient had a PCN reaction that required hospitalization: No Has patient had a PCN reaction occurring within the last 10 years: Yes If all of the above answers are "NO", then may proceed with Cephalosporin use.    VITALS:  Blood pressure 115/77, pulse 97, temperature 98.4 F (36.9 C), temperature source Oral, resp. rate 16, height 5\' 8"  (1.727 m), weight 211 lb 9.6 oz (96 kg), last menstrual period 10/12/2017, SpO2 96 %. PHYSICAL  EXAMINATION:  Physical Exam  Constitutional: She is oriented to person, place, and time and well-developed, well-nourished, and in no distress.  HENT:  Head: Normocephalic.  Mouth/Throat: Oropharynx is clear and moist.  Eyes: Conjunctivae and EOM are normal. Pupils are equal, round, and reactive to light. No scleral icterus.  Neck: Normal range of motion. Neck supple. No JVD present. No tracheal deviation present.  Cardiovascular: Normal rate, regular rhythm and normal heart sounds. Exam reveals no gallop.  No murmur heard. Pulmonary/Chest: Effort normal and breath sounds normal. No respiratory distress. She has no wheezes. She has no rales.  Abdominal: Soft. Bowel sounds are normal. She exhibits no distension. There is no tenderness. There is no rebound.  Musculoskeletal: Normal range of motion. She exhibits no edema or tenderness.  Neurological: She is alert and oriented to person, place, and time. No cranial nerve deficit.  Skin: No rash noted. No erythema.  Psychiatric: Affect normal.   LABORATORY PANEL:  Female CBC Recent Labs  Lab 12/03/17 0233  WBC 13.1*  HGB 12.3  HCT 36.7  PLT 189   ------------------------------------------------------------------------------------------------------------------ Chemistries  Recent Labs  Lab 12/02/17 2037 12/03/17 0233  NA 133* 134*  K 3.1* 2.9*  CL 101 102  CO2 21* 19*  GLUCOSE 110* 103*  BUN 11 11  CREATININE 0.93 0.91  CALCIUM 8.3* 7.8*  MG  --  1.3*  AST 13*  --   ALT 11*  --   ALKPHOS 72  --   BILITOT 2.0*  --    RADIOLOGY:  Ct Renal Stone Study  Result Date: 12/02/2017 CLINICAL DATA:  Left flank pain. EXAM: CT ABDOMEN AND PELVIS WITHOUT CONTRAST TECHNIQUE: Multidetector CT imaging of the abdomen and  pelvis was performed following the standard protocol without IV contrast. COMPARISON:  05/01/2017 FINDINGS: Lower chest: No acute abnormality. Hepatobiliary: No focal liver abnormality is seen. No gallstones, gallbladder  wall thickening, or biliary dilatation. Pancreas: Unremarkable. No pancreatic ductal dilatation or surrounding inflammatory changes. Spleen: Normal in size without focal abnormality. Adrenals/Urinary Tract: Normal bilateral adrenal glands. Normal right kidney, right ureter and urinary bladder. Marked left hydronephrosis with perinephric fat stranding. The left ureter has irregular contour and beaded appearance, and demonstrates mild dilation to the level of the pelvis. The distal most left ureter is not well visualized. No radiopaque ureteral calculus is seen. There are 3 nonobstructive left renal calculi, the largest in the lower pole of the kidney measuring 6 mm. Stomach/Bowel: Stomach is within normal limits. Appendix appears normal. No evidence of bowel wall thickening, distention, or inflammatory changes. Vascular/Lymphatic: No significant vascular findings are present. No enlarged abdominal or pelvic lymph nodes. Reproductive: Uterus and bilateral adnexa are unremarkable. 3.6 cm left ovarian cyst, likely physiologic. Other: No abdominal wall hernia or abnormality. Small amount of free fluid in the pelvis. Musculoskeletal: No acute or significant osseous findings. IMPRESSION: Markedly abnormal appearance of the left kidney with moderate to marked hydronephrosis and perinephric stranding. Milder left hydroureter to the level of the left ovary, with abnormal beaded irregular appearance of the left ureter. The distal most left ureter is not seen. Three nonobstructive left renal calculi, measuring up to 6 mm. 3.6 cm left ovarian cyst, probably physiologic. These results were called by telephone at the time of interpretation on 12/02/2017 at 6:46 pm to Dr. Minna AntisKEVIN PADUCHOWSKI , who verbally acknowledged these results. Electronically Signed   By: Ted Mcalpineobrinka  Dimitrova M.D.   On: 12/02/2017 18:50   ASSESSMENT AND PLAN:   Sepsis due to acute pyelonephritis Continue Rocephin, follow-up CBC, blood culture and urine  culture.  Left nephrolithiasis and ureteral stricture No indication for percutaneous nephrostomy at this time.  Continue IV antibiotics per Dr. Lonna CobbStoioff.  Hypokalemia.  Give potassium supplement in the follow-up level. Hypomagnesemia, given magnesium in the follow-up level. Hyponatremia.  Continue normal saline IV and follow-up BMP.  Tobacco abuse.  Smoking cessation was counseled for 4 minutes.  All the records are reviewed and case discussed with Care Management/Social Worker. Management plans discussed with the patient, family and they are in agreement.  CODE STATUS: Full Code  TOTAL TIME TAKING CARE OF THIS PATIENT: 36 minutes.   More than 50% of the time was spent in counseling/coordination of care: YES  POSSIBLE D/C IN 2 DAYS, DEPENDING ON CLINICAL CONDITION.   Shaune PollackQing Heyli Min M.D on 12/03/2017 at 3:34 PM  Between 7am to 6pm - Pager - [phone]  After 6pm go to [email or web address] - Therapist, nutritionalpassword [email or web address] [email or web address]  [facility] [place] [facility]

## 2017-12-03 NOTE — Consult Note (Signed)
Urology Follow-Up Consult  She states she is feeling better this morning with less flank pain. WBC decreased to 13.7 Max temp 99.3.    Impression: Clinically improved.  No indication for percutaneous nephrostomy at this time.  Continue IV antibiotics.  Will follow.

## 2017-12-03 NOTE — Progress Notes (Signed)
CH responded to an OR for an AD. Pt was asleep on my visit. CH informed RN that the document was left on the Pt table. CH is available for education if and when desired.    12/03/17 1100  Clinical Encounter Type  Visited With Patient;Patient not available;Health care provider  Visit Type Initial;Spiritual support  Referral From Nurse  Consult/Referral To Chaplain  Spiritual Encounters  Spiritual Needs Literature

## 2017-12-04 DIAGNOSIS — R109 Unspecified abdominal pain: Secondary | ICD-10-CM

## 2017-12-04 LAB — BASIC METABOLIC PANEL WITH GFR
Anion gap: 7 (ref 5–15)
BUN: 12 mg/dL (ref 6–20)
CO2: 22 mmol/L (ref 22–32)
Calcium: 8.1 mg/dL — ABNORMAL LOW (ref 8.9–10.3)
Chloride: 101 mmol/L (ref 101–111)
Creatinine, Ser: 0.79 mg/dL (ref 0.44–1.00)
GFR calc Af Amer: >60 mL/min (ref >60)
GFR calc non Af Amer: >60 mL/min (ref >60)
Glucose, Bld: 100 mg/dL — ABNORMAL HIGH (ref 65–99)
Potassium: 3.7 mmol/L (ref 3.5–5.1)
Sodium: 130 mmol/L — ABNORMAL LOW (ref 135–145)

## 2017-12-04 LAB — MAGNESIUM: MAGNESIUM: 1.9 mg/dL (ref 1.7–2.4)

## 2017-12-04 MED ORDER — TRAZODONE HCL 100 MG PO TABS
150.0000 mg | ORAL_TABLET | Freq: Every evening | ORAL | Status: DC | PRN
  Administered 2017-12-04: 150 mg via ORAL
  Filled 2017-12-04: qty 2

## 2017-12-04 MED ORDER — SODIUM CHLORIDE 0.9 % IV SOLN
INTRAVENOUS | Status: DC
  Administered 2017-12-04 – 2017-12-05 (×3): via INTRAVENOUS

## 2017-12-04 NOTE — Progress Notes (Signed)
Patient requested medication for sleep. Given orders for trazodone by Dr. Katheren ShamsSalary.

## 2017-12-04 NOTE — Consult Note (Signed)
Urology Follow-Up Consult Note  She states her flank pain is significantly better. Max temp past 24 hours 99.3 Urine culture growing E. coli sensitivities pending  Impression: Pyelonephritis responding to antibiotic therapy.  CT findings most likely chronic and not obstructive  Recommendation: Would switch to oral antibiotics once sensitivities are back.  She had an appointment with Dr. Apolinar JunesBrandon on 12/21 which was canceled.  Would recommend follow-up appointment after discharge.  Will sign off.  Please contact for any additional problems.

## 2017-12-04 NOTE — Progress Notes (Signed)
[employer] - [place] [facility]   PATIENT NAME: Kathleen Hutchinson    MR#:  [id number]  DATE OF BIRTH:  [birth year]  SUBJECTIVE:  CHIEF COMPLAINT:   Chief Complaint  Patient presents with  . Flank Pain   The patient complains of headache REVIEW OF SYSTEMS:  Review of Systems  Constitutional: Negative for chills, fever and malaise/fatigue.  HENT: Negative for sore throat.   Eyes: Negative for blurred vision and double vision.  Respiratory: Negative for cough, hemoptysis, shortness of breath, wheezing and stridor.   Cardiovascular: Negative for chest pain, palpitations, orthopnea and leg swelling.  Gastrointestinal: Negative for abdominal pain, blood in stool, diarrhea, melena, nausea and vomiting.  Genitourinary: Negative for dysuria, flank pain and hematuria.  Musculoskeletal: Negative for back pain and joint pain.  Skin: Negative for rash.  Neurological: Positive for headaches. Negative for dizziness, sensory change, focal weakness, seizures, loss of consciousness and weakness.  Endo/Heme/Allergies: Negative for polydipsia.  Psychiatric/Behavioral: Negative for depression. The patient is not nervous/anxious.     DRUG ALLERGIES:   Allergies  Allergen Reactions  . Augmentin [Amoxicillin-Pot Clavulanate] Rash    Has patient had a PCN reaction causing immediate rash, facial/tongue/throat swelling, SOB or lightheadedness with hypotension: Yes Has patient had a PCN reaction causing severe rash involving mucus membranes or skin necrosis: Yes Has patient had a PCN reaction that required hospitalization: No Has patient had a PCN reaction occurring within the last 10 years: Yes If all of the above answers are "NO", then may proceed with Cephalosporin use.    VITALS:  Blood pressure 125/72, pulse 99, temperature 99.3 F (37.4 C), temperature source Oral, resp. rate 18, height 5\' 8"  (1.727 m), weight 211 lb 9.6 oz (96 kg), last menstrual period 10/12/2017,  SpO2 97 %. PHYSICAL EXAMINATION:  Physical Exam  Constitutional: She is oriented to person, place, and time and well-developed, well-nourished, and in no distress.  HENT:  Head: Normocephalic.  Mouth/Throat: Oropharynx is clear and moist.  Eyes: Conjunctivae and EOM are normal. Pupils are equal, round, and reactive to light. No scleral icterus.  Neck: Normal range of motion. Neck supple. No JVD present. No tracheal deviation present.  Cardiovascular: Normal rate, regular rhythm and normal heart sounds. Exam reveals no gallop.  No murmur heard. Pulmonary/Chest: Effort normal and breath sounds normal. No respiratory distress. She has no wheezes. She has no rales.  Abdominal: Soft. Bowel sounds are normal. She exhibits no distension. There is no tenderness. There is no rebound.  Musculoskeletal: Normal range of motion. She exhibits no edema or tenderness.  Neurological: She is alert and oriented to person, place, and time. No cranial nerve deficit.  Skin: No rash noted. No erythema.  Psychiatric: Affect normal.   LABORATORY PANEL:  Female CBC Recent Labs  Lab 12/03/17 0233  WBC 13.1*  HGB 12.3  HCT 36.7  PLT 189   ------------------------------------------------------------------------------------------------------------------ Chemistries  Recent Labs  Lab 12/02/17 2037  12/04/17 0451  NA 133*   < > 130*  K 3.1*   < > 3.7  CL 101   < > 101  CO2 21*   < > 22  GLUCOSE 110*   < > 100*  BUN 11   < > 12  CREATININE 0.93   < > 0.79  CALCIUM 8.3*   < > 8.1*  MG  --    < > 1.9  AST 13*  --   --   ALT 11*  --   --  ALKPHOS 72  --   --   BILITOT 2.0*  --   --    < > = values in this interval not displayed.   RADIOLOGY:  No results found. ASSESSMENT AND PLAN:   Sepsis due to acute pyelonephritis Continue Rocephin, follow-up CBC, urine culture (ESCHERICHIA COLI).  Follow-up sensitivity  Left nephrolithiasis and ureteral stricture No indication for percutaneous nephrostomy at  this time.  Continue IV antibiotics per Dr. Lonna CobbStoioff.  Hypokalemia.  Improved with potassium supplement. Hypomagnesemia, given magnesium and improved Hyponatremia.  Worsening, continue normal saline IV and follow-up BMP.  Tobacco abuse.  Smoking cessation was counseled for 4 minutes.  All the records are reviewed and case discussed with Care Management/Social Worker. Management plans discussed with the patient, family and they are in agreement.  CODE STATUS: Full Code  TOTAL TIME TAKING CARE OF THIS PATIENT: 28 minutes.   More than 50% of the time was spent in counseling/coordination of care: YES  POSSIBLE D/C IN 1-2 DAYS, DEPENDING ON CLINICAL CONDITION.   Shaune PollackQing Ajayla Iglesias M.D on 12/04/2017 at 2:02 PM  Between 7am to 6pm - Pager - [phone]  After 6pm go to [email or web address] - Therapist, nutritionalpassword EPAS [facility]  [facility] [place] [facility]

## 2017-12-04 NOTE — Plan of Care (Signed)
Patient complains of an ongoing headache, adjust environment along with pain medications.

## 2017-12-05 LAB — URINE CULTURE

## 2017-12-05 MED ORDER — CIPROFLOXACIN HCL 500 MG PO TABS
500.0000 mg | ORAL_TABLET | Freq: Two times a day (BID) | ORAL | Status: DC
  Administered 2017-12-05: 500 mg via ORAL
  Filled 2017-12-05 (×2): qty 1

## 2017-12-05 MED ORDER — OXYCODONE HCL 5 MG PO TABS: 5.0000 mg | ORAL_TABLET | Freq: Four times a day (QID) | ORAL | 0 refills | Status: DC | PRN

## 2017-12-05 MED ORDER — CIPROFLOXACIN HCL 500 MG PO TABS: 500.0000 mg | ORAL_TABLET | Freq: Two times a day (BID) | ORAL | 0 refills | Status: DC

## 2017-12-05 NOTE — Discharge Summary (Signed)
[facility] [place] [facility]   PATIENT NAME: Kathleen Hutchinson    MR#:  [id number]  DATE OF BIRTH:  08-24-1986  DATE OF ADMISSION:  12/02/2017 ADMITTING PHYSICIAN: Oralia Manis, MD  DATE OF DISCHARGE: 12/05/2017  PRIMARY CARE PHYSICIAN: Patient, No Pcp Per    ADMISSION DIAGNOSIS:  Ureter obstruction [N13.5] Flank pain [R10.9] Acute pyelonephritis [N10] Urinary tract infection without hematuria, site unspecified [N39.0]  DISCHARGE DIAGNOSIS:  Left sided pyelonephritis UTI-Ecoli -left hydronephrosis with known h/o non obstructive nephrolithiasis  SECONDARY DIAGNOSIS:   Past Medical History:  Diagnosis Date  . Headaches, cluster   . History of kidney stones   . Nephrolithiasis     HOSPITAL COURSE:  Kathleen Hutchinson  is a 31 y.o. female who presents with left flank pain with increasing malaise and nausea over the last several days.  She is found to meet sepsis criteria and on imaging likely has pyelonephritis   1.Sepsis due to acute left sided pyelonephritis --change to po ciprofloxacin, -UC pansensitive e coli  2.Left nephrolithiasis and ureteral stricture No indication for percutaneous nephrostomy at this time. Continue IV antibiotics per Dr. Lonna Cobb. -good uop and creat stable  3.Hypokalemia.  Improved with potassium supplement--repeleted  4.Tobacco abuse.  Smoking cessation was counseled for 4 minutes.  Overall stable Pt agreeable for d/c. She is recommended to f/u with urology as outpt.  CONSULTS OBTAINED:  Treatment Team:  Jerilee Field, MD  DRUG ALLERGIES:   Allergies  Allergen Reactions  . Augmentin [Amoxicillin-Pot Clavulanate] Rash    Has patient had a PCN reaction causing immediate rash, facial/tongue/throat swelling, SOB or lightheadedness with hypotension: Yes Has patient had a PCN reaction causing severe rash involving mucus membranes or skin necrosis: Yes Has patient had a PCN reaction that required  hospitalization: No Has patient had a PCN reaction occurring within the last 10 years: Yes If all of the above answers are "NO", then may proceed with Cephalosporin use.     DISCHARGE MEDICATIONS:   Allergies as of 12/05/2017      Reactions   Augmentin [amoxicillin-pot Clavulanate] Rash   Has patient had a PCN reaction causing immediate rash, facial/tongue/throat swelling, SOB or lightheadedness with hypotension: Yes Has patient had a PCN reaction causing severe rash involving mucus membranes or skin necrosis: Yes Has patient had a PCN reaction that required hospitalization: No Has patient had a PCN reaction occurring within the last 10 years: Yes If all of the above answers are "NO", then may proceed with Cephalosporin use.      Medication List    STOP taking these medications   oxybutynin 5 MG tablet Commonly known as:  DITROPAN     TAKE these medications   acetaminophen 325 MG tablet Commonly known as:  TYLENOL Take 650 mg by mouth every 6 (six) hours as needed for mild pain.   albuterol 108 (90 Base) MCG/ACT inhaler Commonly known as:  PROVENTIL HFA;VENTOLIN HFA Inhale into the lungs every 6 (six) hours as needed for wheezing or shortness of breath.   ciprofloxacin 500 MG tablet Commonly known as:  CIPRO Take 1 tablet (500 mg total) by mouth 2 (two) times daily.   diphenhydrAMINE 25 MG tablet Commonly known as:  BENADRYL Take 25 mg by mouth daily as needed for allergies.   diphenhydramine-acetaminophen 25-500 MG Tabs tablet Commonly known as:  TYLENOL PM Take 2 tablets by mouth at bedtime as needed (sleep).   ibuprofen 200 MG tablet Commonly known as:  ADVIL,MOTRIN  Take 200 mg by mouth every 6 (six) hours as needed.   multivitamin tablet Take 1 tablet by mouth daily.   oxyCODONE 5 MG immediate release tablet Commonly known as:  Oxy IR/ROXICODONE Take 1 tablet (5 mg total) by mouth every 6 (six) hours as needed for moderate pain or severe pain.   tamsulosin  0.4 MG Caps capsule Commonly known as:  FLOMAX Take 0.4 mg by mouth daily.       If you experience worsening of your admission symptoms, develop shortness of breath, life threatening emergency, suicidal or homicidal thoughts you must seek medical attention immediately by calling 911 or calling your MD immediately  if symptoms less severe.  You Must read complete instructions/literature along with all the possible adverse reactions/side effects for all the Medicines you take and that have been prescribed to you. Take any new Medicines after you have completely understood and accept all the possible adverse reactions/side effects.   Please note  You were cared for by a hospitalist during your hospital stay. If you have any questions about your discharge medications or the care you received while you were in the hospital after you are discharged, you can call the unit and asked to speak with the hospitalist on call if the hospitalist that took care of you is not available. Once you are discharged, your primary care physician will handle any further medical issues. Please note that NO REFILLS for any discharge medications will be authorized once you are discharged, as it is imperative that you return to your primary care physician (or establish a relationship with a primary care physician if you do not have one) for your aftercare needs so that they can reassess your need for medications and monitor your lab values. Today   SUBJECTIVE   Mild flank pin. Doing overall better  VITAL SIGNS:  Blood pressure 105/62, pulse 75, temperature 98.2 F (36.8 C), temperature source Oral, resp. rate 17, height 5\' 8"  (1.727 m), weight 96 kg (211 lb 9.6 oz), last menstrual period 10/12/2017, SpO2 98 %.  I/O:    Intake/Output Summary (Last 24 hours) at 12/05/2017 0943 Last data filed at 12/05/2017 0706 Gross per 24 hour  Intake 3759.59 ml  Output 900 ml  Net 2859.59 ml    PHYSICAL EXAMINATION:   GENERAL:  31 y.o.-year-old patient lying in the bed with no acute distress.  EYES: Pupils equal, round, reactive to light and accommodation. No scleral icterus. Extraocular muscles intact.  HEENT: Head atraumatic, normocephalic. Oropharynx and nasopharynx clear.  NECK:  Supple, no jugular venous distention. No thyroid enlargement, no tenderness.  LUNGS: Normal breath sounds bilaterally, no wheezing, rales,rhonchi or crepitation. No use of accessory muscles of respiration.  CARDIOVASCULAR: S1, S2 normal. No murmurs, rubs, or gallops.  ABDOMEN: Soft, non-tender, non-distended. Bowel sounds present. No organomegaly or mass.  EXTREMITIES: No pedal edema, cyanosis, or clubbing.  NEUROLOGIC: Cranial nerves II through XII are intact. Muscle strength 5/5 in all extremities. Sensation intact. Gait not checked.  PSYCHIATRIC: The patient is alert and oriented x 3.  SKIN: No obvious rash, lesion, or ulcer.   DATA REVIEW:   CBC  Recent Labs  Lab 12/03/17 0233  WBC 13.1*  HGB 12.3  HCT 36.7  PLT 189    Chemistries  Recent Labs  Lab 12/02/17 2037  12/04/17 0451  NA 133*   < > 130*  K 3.1*   < > 3.7  CL 101   < > 101  CO2 21*   < >  22  GLUCOSE 110*   < > 100*  BUN 11   < > 12  CREATININE 0.93   < > 0.79  CALCIUM 8.3*   < > 8.1*  MG  --    < > 1.9  AST 13*  --   --   ALT 11*  --   --   ALKPHOS 72  --   --   BILITOT 2.0*  --   --    < > = values in this interval not displayed.    Microbiology Results   Recent Results (from the past 240 hour(s))  Urine culture     Status: Abnormal   Collection Time: 12/02/17  5:39 PM  Result Value Ref Range Status   Specimen Description   Final    URINE, RANDOM Performed at [facility], [address] [place], [place] [address]    Special Requests   Final    NONE Performed at [facility], [address] [place], [place] [address]    Culture >=100,000 COLONIES/mL ESCHERICHIA COLI (A)  Final   Report Status 12/05/2017  FINAL  Final   Organism ID, Bacteria ESCHERICHIA COLI (A)  Final      Susceptibility   Escherichia coli - MIC*    AMPICILLIN 4 SENSITIVE Sensitive     CEFAZOLIN <=4 SENSITIVE Sensitive     CEFTRIAXONE <=1 SENSITIVE Sensitive     CIPROFLOXACIN <=0.25 SENSITIVE Sensitive     GENTAMICIN <=1 SENSITIVE Sensitive     IMIPENEM <=0.25 SENSITIVE Sensitive     NITROFURANTOIN <=16 SENSITIVE Sensitive     TRIMETH/SULFA <=20 SENSITIVE Sensitive     AMPICILLIN/SULBACTAM <=2 SENSITIVE Sensitive     PIP/TAZO <=4 SENSITIVE Sensitive     Extended ESBL NEGATIVE Sensitive     * >=100,000 COLONIES/mL ESCHERICHIA COLI    RADIOLOGY:  No results found.   Management plans discussed with the patient, family and they are in agreement.  CODE STATUS:     Code Status Orders  (From admission, onward)        Start     Ordered   12/02/17 2237  Full code  Continuous     12/02/17 2236    Code Status History    Date Active Date Inactive Code Status Order ID Comments User Context   07/27/2017 16:33 07/29/2017 19:11 Full Code [id number]  Vanna ScotlandBrandon, Ashley, MD Inpatient   05/01/2017 18:24 05/02/2017 15:16 Full Code [id number]  Milagros LollSudini, Srikar, MD ED      TOTAL TIME TAKING CARE OF THIS PATIENT: *40 minutes.    Enedina FinnerSona Torrance Stockley M.D on 12/05/2017 at 9:43 AM  Between 7am to 6pm - Pager - [phone] After 6pm go to [email or web address] - password [email or web address]  [facility]  Office  [phone]  CC: Primary care physician; Patient, No Pcp Per

## 2017-12-17 ENCOUNTER — Telehealth: Payer: Self-pay | Admitting: Urology

## 2017-12-17 NOTE — Telephone Encounter (Signed)
Patient notified she cannot come today to leave a sample but will come tomorrow morning

## 2017-12-17 NOTE — Telephone Encounter (Signed)
Patient called to make her follow up and stated that she is having bladder spasms and is wanting something called in to her pharmacy [facility] on [address]  Marcelino DusterMichelle

## 2017-12-17 NOTE — Telephone Encounter (Signed)
Would recommend she have a urinalysis to make sure she does not have a recurrent infection.

## 2017-12-18 ENCOUNTER — Other Ambulatory Visit (INDEPENDENT_AMBULATORY_CARE_PROVIDER_SITE_OTHER): Payer: BLUE CROSS/BLUE SHIELD

## 2017-12-18 ENCOUNTER — Other Ambulatory Visit: Payer: Self-pay

## 2017-12-18 ENCOUNTER — Telehealth: Payer: Self-pay | Admitting: Urology

## 2017-12-18 DIAGNOSIS — N390 Urinary tract infection, site not specified: Secondary | ICD-10-CM

## 2017-12-18 LAB — URINALYSIS, COMPLETE
BILIRUBIN UA: NEGATIVE
Glucose, UA: NEGATIVE
KETONES UA: NEGATIVE
NITRITE UA: NEGATIVE
PH UA: 6 (ref 5.0–7.5)
SPEC GRAV UA: 1.02 (ref 1.005–1.030)
Urobilinogen, Ur: 0.2 mg/dL (ref 0.2–1.0)

## 2017-12-18 LAB — MICROSCOPIC EXAMINATION

## 2017-12-18 NOTE — Telephone Encounter (Signed)
-----   Message from Riki AltesScott C Stoioff, MD sent at 12/17/2017 12:58 PM EST ----- I saw this pt as a hospital consult- she is sched for f/u with me 1/23 but had a ureteral reimplant by Dr. Apolinar JunesBrandon in August. She indicated she wanted to follow up with Dr. Apolinar JunesBrandon.

## 2017-12-18 NOTE — Telephone Encounter (Signed)
Patient wanted to see Kathleen Hutchinson since she did her surgery

## 2017-12-29 ENCOUNTER — Encounter: Payer: Self-pay | Admitting: Urology

## 2017-12-29 ENCOUNTER — Ambulatory Visit (INDEPENDENT_AMBULATORY_CARE_PROVIDER_SITE_OTHER): Payer: BLUE CROSS/BLUE SHIELD | Admitting: Urology

## 2017-12-29 VITALS — BP 112/80 | HR 80 | Ht 68.0 in | Wt 212.8 lb

## 2017-12-29 DIAGNOSIS — N135 Crossing vessel and stricture of ureter without hydronephrosis: Secondary | ICD-10-CM

## 2017-12-29 DIAGNOSIS — N3090 Cystitis, unspecified without hematuria: Secondary | ICD-10-CM

## 2017-12-29 LAB — MICROSCOPIC EXAMINATION: RBC, UA: NONE SEEN /hpf (ref 0–?)

## 2017-12-29 LAB — URINALYSIS, COMPLETE
BILIRUBIN UA: NEGATIVE
Glucose, UA: NEGATIVE
KETONES UA: NEGATIVE
Nitrite, UA: NEGATIVE
Protein, UA: NEGATIVE
SPEC GRAV UA: 1.015 (ref 1.005–1.030)
UUROB: 0.2 mg/dL (ref 0.2–1.0)
pH, UA: 5.5 (ref 5.0–7.5)

## 2017-12-29 NOTE — Progress Notes (Signed)
12/29/2017 10:30 AM   Kathleen Hutchinson [birth year] [id number]  Referring provider: No referring provider defined for this encounter.  Chief Complaint  Patient presents with  . Recurrent UTI    HPI: 32 year old female with a severe left distal ureteral ureteral stricture secondary to retained left distal obstructing ureteral stone status post robotic left ureteral implant on 07/06/2017.  Preoperatively, Lasix renogram confirmed excellent function of her left kidney, 47% on the left with mild delayed uptake and excretion of the radiotracer secondary to a dilated collecting system.  She did have a massive hydroureteronephrosis down to the level of the stricture.  Postoperatively, her stent remained in place for 4 weeks.  It was subsequently removed.  She has not seen in follow-up and doing well until recent admission just after Christmas 2018.  She reports that she developed fevers  and left flank pain with several days of preceding left urinary tract symptoms felt to be consistent with a UTI.  She reports that she was traveling and did not have time to seek medical care.  UA grew E. coli and she was discharged home on oral antibiotic completed.  About 2 weeks ago, she did have recurrent urinary tract symptoms including lower abdominal pressure and cramping.  She dropped off a urine last week but it does not appear that a culture was sent.  This was ended around the time of her menstrual cycle.  After her cycle was complete, all of her urinary symptoms resolved.  She is asymptomatic today.  No flank pain.  No dysuria, gross hematuria, or any other urinary symptoms.  UA is mildly suspicious.   PMH: Past Medical History:  Diagnosis Date  . Headaches, cluster   . History of kidney stones   . Nephrolithiasis     Surgical History: Past Surgical History:  Procedure Laterality Date  . CESAREAN SECTION    . CYSTOSCOPY W/ RETROGRADES Left 05/01/2017   Procedure: CYSTOSCOPY WITH  RETROGRADE PYELOGRAM;  Surgeon: Bjorn Pippin, MD;  Location: [facility];  Service: Urology;  Laterality: Left;  . CYSTOSCOPY W/ RETROGRADES Left 07/06/2017   Procedure: CYSTOSCOPY WITH RETROGRADE PYELOGRAM;  Surgeon: Vanna Scotland, MD;  Location: [facility];  Service: Urology;  Laterality: Left;  . CYSTOSCOPY W/ URETERAL STENT PLACEMENT Left 07/06/2017   Procedure: CYSTOSCOPY WITH STENT REPLACEMENT;  Surgeon: Vanna Scotland, MD;  Location: [facility];  Service: Urology;  Laterality: Left;  . CYSTOSCOPY WITH STENT PLACEMENT Left 05/01/2017   Procedure: CYSTOSCOPY WITH STENT PLACEMENT;  Surgeon: Bjorn Pippin, MD;  Location: [facility];  Service: Urology;  Laterality: Left;  . CYSTOSCOPY/URETEROSCOPY/HOLMIUM LASER/STENT PLACEMENT Left 10/02/2016  . ROBOTICALLY ASSISTED LAPAROSCOPIC URETERAL RE-IMPLANTATION Left 07/27/2017   Procedure: ROBOTICALLY ASSISTED LAPAROSCOPIC URETERAL RE-IMPLANTATION;  Surgeon: Vanna Scotland, MD;  Location: [facility];  Service: Urology;  Laterality: Left;  . TUBAL LIGATION    . URETEROSCOPY WITH HOLMIUM LASER LITHOTRIPSY Left 07/06/2017   Procedure: URETEROSCOPY WITH HOLMIUM LASER LITHOTRIPSY;  Surgeon: Vanna Scotland, MD;  Location: [facility];  Service: Urology;  Laterality: Left;  Marland Kitchen VAGINAL DELIVERY     x 4    Home Medications:  Allergies as of 12/29/2017      Reactions   Augmentin [amoxicillin-pot Clavulanate] Rash   Has patient had a PCN reaction causing immediate rash, facial/tongue/throat swelling, SOB or lightheadedness with hypotension: Yes Has patient had a PCN reaction causing severe rash involving mucus membranes or skin necrosis: Yes Has patient had a PCN reaction that required hospitalization: No Has patient had a  PCN reaction occurring within the last 10 years: Yes If all of the above answers are "NO", then may proceed with Cephalosporin use.      Medication List        Accurate as of 12/29/17 10:30 AM. Always use your most recent med list.            acetaminophen 325 MG tablet Commonly known as:  TYLENOL Take 650 mg by mouth every 6 (six) hours as needed for mild pain.   albuterol 108 (90 Base) MCG/ACT inhaler Commonly known as:  PROVENTIL HFA;VENTOLIN HFA Inhale into the lungs every 6 (six) hours as needed for wheezing or shortness of breath.   diphenhydrAMINE 25 MG tablet Commonly known as:  BENADRYL Take 25 mg by mouth daily as needed for allergies.   diphenhydramine-acetaminophen 25-500 MG Tabs tablet Commonly known as:  TYLENOL PM Take 2 tablets by mouth at bedtime as needed (sleep).   ibuprofen 200 MG tablet Commonly known as:  ADVIL,MOTRIN Take 200 mg by mouth every 6 (six) hours as needed.   multivitamin tablet Take 1 tablet by mouth daily.       Allergies:  Allergies  Allergen Reactions  . Augmentin [Amoxicillin-Pot Clavulanate] Rash    Has patient had a PCN reaction causing immediate rash, facial/tongue/throat swelling, SOB or lightheadedness with hypotension: Yes Has patient had a PCN reaction causing severe rash involving mucus membranes or skin necrosis: Yes Has patient had a PCN reaction that required hospitalization: No Has patient had a PCN reaction occurring within the last 10 years: Yes If all of the above answers are "NO", then may proceed with Cephalosporin use.     Family History: Family History  Problem Relation Age of Onset  . Cholecystitis Mother   . Nephrolithiasis Neg Hx   . Migraines Neg Hx     Social History:  reports that she has been smoking cigarettes.  She has a 2.50 pack-year smoking history. she has never used smokeless tobacco. She reports that she uses drugs. Drug: Marijuana. She reports that she does not drink alcohol.  ROS: UROLOGY Frequent Urination?: Yes Hard to postpone urination?: No Burning/pain with urination?: No Get up at night to urinate?: Yes Leakage of urine?: Yes Urine stream starts and stops?: No Trouble starting stream?: No Do you have to strain to  urinate?: No Blood in urine?: No Urinary tract infection?: No Sexually transmitted disease?: No Injury to kidneys or bladder?: No Painful intercourse?: No Weak stream?: No Currently pregnant?: No Vaginal bleeding?: No Last menstrual period?: n  Gastrointestinal Nausea?: No Vomiting?: No Indigestion/heartburn?: No Diarrhea?: No Constipation?: No  Constitutional Fever: No Night sweats?: No Weight loss?: No Fatigue?: No  Skin Skin rash/lesions?: No Itching?: No  Eyes Blurred vision?: No Double vision?: No  Ears/Nose/Throat Sore throat?: No Sinus problems?: No  Hematologic/Lymphatic Swollen glands?: No Easy bruising?: No  Cardiovascular Leg swelling?: No Chest pain?: No  Respiratory Cough?: No Shortness of breath?: No  Endocrine Excessive thirst?: No  Musculoskeletal Back pain?: No Joint pain?: No  Neurological Headaches?: No Dizziness?: No  Psychologic Depression?: No Anxiety?: No  Physical Exam: BP 112/80 (BP Location: Left Arm, Patient Position: Sitting, Cuff Size: Large)   Pulse 80   Ht 5\' 8"  (1.727 m)   Wt 212 lb 12.8 oz (96.5 kg)   BMI 32.36 kg/m   Constitutional:  Alert and oriented, No acute distress. HEENT: [place] AT, moist mucus membranes.  Trachea midline, no masses. Cardiovascular: No clubbing, cyanosis, or edema. Respiratory: Normal  respiratory effort, no increased work of breathing. GI: Abdomen is soft, nontender, nondistended, no abdominal masses.  Incisions healed well. GU: No CVA tenderness.  Skin: No rashes, bruises or suspicious lesions. Neurologic: Grossly intact, no focal deficits, moving all 4 extremities. Psychiatric: Normal mood and affect.  Laboratory Data: Lab Results  Component Value Date   WBC 13.1 (H) 12/03/2017   HGB 12.3 12/03/2017   HCT 36.7 12/03/2017   MCV 88.0 12/03/2017   PLT 189 12/03/2017    Lab Results  Component Value Date   CREATININE 0.79 12/04/2017    Urinalysis Results for orders placed  or performed in visit on 12/29/17  Microscopic Examination  Result Value Ref Range   WBC, UA 11-30 (A) 0 - 5 /hpf   RBC, UA None seen 0 - 2 /hpf   Epithelial Cells (non renal) 0-10 0 - 10 /hpf   Mucus, UA Present (A) Not Estab.   Bacteria, UA Few (A) None seen/Few  Urinalysis, Complete  Result Value Ref Range   Specific Gravity, UA 1.015 1.005 - 1.030   pH, UA 5.5 5.0 - 7.5   Color, UA Yellow Yellow   Appearance Ur Cloudy (A) Clear   Leukocytes, UA 2+ (A) Negative   Protein, UA Negative Negative/Trace   Glucose, UA Negative Negative   Ketones, UA Negative Negative   RBC, UA 1+ (A) Negative   Bilirubin, UA Negative Negative   Urobilinogen, Ur 0.2 0.2 - 1.0 mg/dL   Nitrite, UA Negative Negative   Microscopic Examination See below:     Pertinent Imaging: Results for orders placed during the hospital encounter of 12/02/17  CT Renal Stone Study   Narrative CLINICAL DATA:  Left flank pain.  EXAM: CT ABDOMEN AND PELVIS WITHOUT CONTRAST  TECHNIQUE: Multidetector CT imaging of the abdomen and pelvis was performed following the standard protocol without IV contrast.  COMPARISON:  05/01/2017  FINDINGS: Lower chest: No acute abnormality.  Hepatobiliary: No focal liver abnormality is seen. No gallstones, gallbladder wall thickening, or biliary dilatation.  Pancreas: Unremarkable. No pancreatic ductal dilatation or surrounding inflammatory changes.  Spleen: Normal in size without focal abnormality.  Adrenals/Urinary Tract: Normal bilateral adrenal glands. Normal right kidney, right ureter and urinary bladder. Marked left hydronephrosis with perinephric fat stranding. The left ureter has irregular contour and beaded appearance, and demonstrates mild dilation to the level of the pelvis. The distal most left ureter is not well visualized. No radiopaque ureteral calculus is seen. There are 3 nonobstructive left renal calculi, the largest in the lower pole of the kidney  measuring 6 mm.  Stomach/Bowel: Stomach is within normal limits. Appendix appears normal. No evidence of bowel wall thickening, distention, or inflammatory changes.  Vascular/Lymphatic: No significant vascular findings are present. No enlarged abdominal or pelvic lymph nodes.  Reproductive: Uterus and bilateral adnexa are unremarkable. 3.6 cm left ovarian cyst, likely physiologic.  Other: No abdominal wall hernia or abnormality. Small amount of free fluid in the pelvis.  Musculoskeletal: No acute or significant osseous findings.  IMPRESSION: Markedly abnormal appearance of the left kidney with moderate to marked hydronephrosis and perinephric stranding. Milder left hydroureter to the level of the left ovary, with abnormal beaded irregular appearance of the left ureter. The distal most left ureter is not seen.  Three nonobstructive left renal calculi, measuring up to 6 mm.  3.6 cm left ovarian cyst, probably physiologic.  These results were called by telephone at the time of interpretation on 12/02/2017 at 6:46 pm to Dr. Minna Antis ,  who verbally acknowledged these results.   Electronically Signed   By: Ted Mcalpineobrinka  Dimitrova M.D.   On: 12/02/2017 18:50     CT scan from inpatient admission was personally reviewed today.  Overall, it appears that her hydronephrosis actually is improving from preop.    Assessment & Plan:    1. 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 Cystitis Recent E. coli pyelonephritis We reviewed her anatomy and her refluxing anastomosis and higher risk for pyelonephritis Encouraged to seek urgent medical attention when she has any signs or symptoms of UTI in the future,  earlier rather than later in the course UA mildly suspicious today Urine culture to rule out infection although this may just be a contaminant as she is currently asymptomatic - Urinalysis, Complete - CULTURE, URINE COMPREHENSIVE  Will call with results and follow-up plan  Vanna ScotlandAshley Jazminn Pomales, MD  [facility] [address], [address] [place], [place] [address] [phone]

## 2017-12-30 ENCOUNTER — Ambulatory Visit: Payer: BLUE CROSS/BLUE SHIELD | Admitting: Urology

## 2017-12-31 LAB — CULTURE, URINE COMPREHENSIVE

## 2018-01-01 ENCOUNTER — Telehealth: Payer: Self-pay

## 2018-01-01 MED ORDER — NITROFURANTOIN MONOHYD MACRO 100 MG PO CAPS: 100.0000 mg | ORAL_CAPSULE | Freq: Two times a day (BID) | ORAL | 0 refills | Status: DC

## 2018-01-01 NOTE — Telephone Encounter (Signed)
-----   Message from Vanna ScotlandAshley [name], MD sent at 01/01/2018  8:08 AM EST ----- Please switch to Macrobid 100 mg bid x 7 days with recheck urine after abx completed.    Vanna ScotlandAshley [name], MD

## 2018-01-01 NOTE — Telephone Encounter (Signed)
LMOM- abx sent to pharmacy 

## 2018-01-27 ENCOUNTER — Ambulatory Visit: Payer: BLUE CROSS/BLUE SHIELD

## 2018-02-08 ENCOUNTER — Encounter
Admission: RE | Admit: 2018-02-08 | Discharge: 2018-02-08 | Disposition: A | Discharge status: Home / Self Care | Payer: BLUE CROSS/BLUE SHIELD | Source: Ambulatory Visit | Attending: Urology | Admitting: Urology

## 2018-02-08 DIAGNOSIS — N135 Crossing vessel and stricture of ureter without hydronephrosis: Secondary | ICD-10-CM | POA: Diagnosis present

## 2018-02-08 MED ORDER — TECHNETIUM TC 99M MERTIATIDE
5.5000 | Freq: Once | INTRAVENOUS | Status: AC | PRN
  Administered 2018-02-08: 5.5 via INTRAVENOUS

## 2018-02-08 MED ORDER — FUROSEMIDE 10 MG/ML IJ SOLN
48.0000 mg | Freq: Once | INTRAMUSCULAR | Status: DC
  Filled 2018-02-08: qty 4.8

## 2018-02-09 ENCOUNTER — Telehealth: Payer: Self-pay

## 2018-02-09 DIAGNOSIS — N135 Crossing vessel and stricture of ureter without hydronephrosis: Secondary | ICD-10-CM

## 2018-02-09 DIAGNOSIS — N1330 Unspecified hydronephrosis: Secondary | ICD-10-CM

## 2018-02-09 NOTE — Telephone Encounter (Signed)
-----   Message from Vanna ScotlandAshley [name], MD sent at 02/09/2018  8:41 AM EST ----- Randie HeinzGreat news.  The scan looks awesome.  There shows no ongoing obstruction on the left side and the left drains as well as the right.  I would like to see you in 1 year with a KUB/ RUS for routine follow-up.  Please schedule this.    Vanna ScotlandAshley [name], MD

## 2018-02-09 NOTE — Telephone Encounter (Signed)
Letter sent. Appts made and orders placed.  

## 2018-04-07 ENCOUNTER — Encounter: Payer: Self-pay | Admitting: Emergency Medicine

## 2018-04-07 ENCOUNTER — Emergency Department
Admission: EM | Admit: 2018-04-07 | Discharge: 2018-04-07 | Disposition: A | Discharge status: Home / Self Care | Payer: BLUE CROSS/BLUE SHIELD | Attending: Emergency Medicine | Admitting: Emergency Medicine

## 2018-04-07 DIAGNOSIS — F17210 Nicotine dependence, cigarettes, uncomplicated: Secondary | ICD-10-CM | POA: Diagnosis not present

## 2018-04-07 DIAGNOSIS — S161XXA Strain of muscle, fascia and tendon at neck level, initial encounter: Secondary | ICD-10-CM | POA: Insufficient documentation

## 2018-04-07 DIAGNOSIS — S39012A Strain of muscle, fascia and tendon of lower back, initial encounter: Secondary | ICD-10-CM | POA: Diagnosis not present

## 2018-04-07 DIAGNOSIS — Y939 Activity, unspecified: Secondary | ICD-10-CM | POA: Diagnosis not present

## 2018-04-07 DIAGNOSIS — S199XXA Unspecified injury of neck, initial encounter: Secondary | ICD-10-CM | POA: Diagnosis present

## 2018-04-07 DIAGNOSIS — Y999 Unspecified external cause status: Secondary | ICD-10-CM | POA: Diagnosis not present

## 2018-04-07 DIAGNOSIS — Y929 Unspecified place or not applicable: Secondary | ICD-10-CM | POA: Insufficient documentation

## 2018-04-07 DIAGNOSIS — M7918 Myalgia, other site: Secondary | ICD-10-CM | POA: Diagnosis not present

## 2018-04-07 DIAGNOSIS — Z79899 Other long term (current) drug therapy: Secondary | ICD-10-CM | POA: Insufficient documentation

## 2018-04-07 MED ORDER — TRAMADOL HCL 50 MG PO TABS
50.0000 mg | ORAL_TABLET | Freq: Once | ORAL | Status: AC
  Administered 2018-04-07: 50 mg via ORAL
  Filled 2018-04-07: qty 1

## 2018-04-07 MED ORDER — IBUPROFEN 800 MG PO TABS
800.0000 mg | ORAL_TABLET | Freq: Once | ORAL | Status: AC
  Administered 2018-04-07: 800 mg via ORAL
  Filled 2018-04-07: qty 1

## 2018-04-07 MED ORDER — CYCLOBENZAPRINE HCL 10 MG PO TABS: 10.0000 mg | ORAL_TABLET | Freq: Three times a day (TID) | ORAL | 0 refills | Status: DC | PRN

## 2018-04-07 MED ORDER — IBUPROFEN 800 MG PO TABS: 800.0000 mg | ORAL_TABLET | Freq: Three times a day (TID) | ORAL | 0 refills | Status: DC | PRN

## 2018-04-07 MED ORDER — TRAMADOL HCL 50 MG PO TABS: 50.0000 mg | ORAL_TABLET | Freq: Four times a day (QID) | ORAL | 0 refills | Status: DC | PRN

## 2018-04-07 MED ORDER — CYCLOBENZAPRINE HCL 10 MG PO TABS
10.0000 mg | ORAL_TABLET | Freq: Once | ORAL | Status: AC
  Administered 2018-04-07: 10 mg via ORAL
  Filled 2018-04-07: qty 1

## 2018-04-07 NOTE — Discharge Instructions (Signed)
Follow discharge care instruction take medication as directed. °
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
## 2018-04-07 NOTE — ED Notes (Signed)
Pt in NAD at time of departure, VSS. Pt ambulatory. Pt mother at lobby for pt transport,.
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
## 2018-04-07 NOTE — ED Notes (Signed)
Pt complains of pains from lower back pain radiating down into left leg. As well as shoulder pain. Pt able to stand and move around the room but states it illicites pain when standing. Able to to raise left arm up to shoulder level but unable to go higher without increasing pain.

## 2018-04-12 ENCOUNTER — Encounter: Payer: Self-pay | Admitting: Emergency Medicine

## 2018-04-12 ENCOUNTER — Emergency Department: Payer: BLUE CROSS/BLUE SHIELD

## 2018-04-12 ENCOUNTER — Emergency Department
Admission: EM | Admit: 2018-04-12 | Discharge: 2018-04-12 | Disposition: A | Discharge status: Home / Self Care | Payer: BLUE CROSS/BLUE SHIELD | Attending: Emergency Medicine | Admitting: Emergency Medicine

## 2018-04-12 DIAGNOSIS — S161XXD Strain of muscle, fascia and tendon at neck level, subsequent encounter: Secondary | ICD-10-CM

## 2018-04-12 DIAGNOSIS — S161XXA Strain of muscle, fascia and tendon at neck level, initial encounter: Secondary | ICD-10-CM | POA: Diagnosis not present

## 2018-04-12 DIAGNOSIS — F17210 Nicotine dependence, cigarettes, uncomplicated: Secondary | ICD-10-CM | POA: Diagnosis not present

## 2018-04-12 DIAGNOSIS — Z79899 Other long term (current) drug therapy: Secondary | ICD-10-CM | POA: Insufficient documentation

## 2018-04-12 DIAGNOSIS — M25562 Pain in left knee: Secondary | ICD-10-CM | POA: Diagnosis not present

## 2018-04-12 DIAGNOSIS — S199XXA Unspecified injury of neck, initial encounter: Secondary | ICD-10-CM | POA: Diagnosis present

## 2018-04-12 DIAGNOSIS — Y999 Unspecified external cause status: Secondary | ICD-10-CM | POA: Diagnosis not present

## 2018-04-12 DIAGNOSIS — M25512 Pain in left shoulder: Secondary | ICD-10-CM | POA: Diagnosis not present

## 2018-04-12 DIAGNOSIS — Y92410 Unspecified street and highway as the place of occurrence of the external cause: Secondary | ICD-10-CM | POA: Diagnosis not present

## 2018-04-12 DIAGNOSIS — Y939 Activity, unspecified: Secondary | ICD-10-CM | POA: Insufficient documentation

## 2018-04-12 MED ORDER — DIAZEPAM 2 MG PO TABS: 2.0000 mg | ORAL_TABLET | Freq: Three times a day (TID) | ORAL | 0 refills | Status: DC | PRN

## 2018-04-12 MED ORDER — KETOROLAC TROMETHAMINE 10 MG PO TABS: 10.0000 mg | ORAL_TABLET | Freq: Four times a day (QID) | ORAL | 0 refills | Status: DC | PRN

## 2018-04-12 MED ORDER — KETOROLAC TROMETHAMINE 30 MG/ML IJ SOLN
30.0000 mg | Freq: Once | INTRAMUSCULAR | Status: AC
  Administered 2018-04-12: 30 mg via INTRAMUSCULAR
  Filled 2018-04-12: qty 1
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
## 2018-04-12 NOTE — ED Triage Notes (Signed)
Pt states that she hurts from her neck to her toes.

## 2018-04-12 NOTE — Discharge Instructions (Addendum)
Continue taking ibuprofen, your muscle relaxant and the tramadol. Toradol 10 mg by mouth every 6 hours as needed for discomfort to be taken with food.  Also diazepam 2 mg 3 times daily for the next 3 days only.  Call make an appointment with your primary care provider in [place].  Use ice or heat to your muscles as needed for discomfort.
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
## 2018-04-21 ENCOUNTER — Ambulatory Visit: Payer: BLUE CROSS/BLUE SHIELD | Admitting: Physician Assistant

## 2018-04-21 ENCOUNTER — Encounter: Payer: Self-pay | Admitting: Physician Assistant

## 2018-04-21 VITALS — BP 104/78 | HR 68 | Temp 98.4°F | Resp 16 | Ht 68.0 in | Wt 212.0 lb

## 2018-04-21 DIAGNOSIS — M25552 Pain in left hip: Secondary | ICD-10-CM | POA: Diagnosis not present

## 2018-04-21 DIAGNOSIS — M25512 Pain in left shoulder: Secondary | ICD-10-CM

## 2018-04-21 DIAGNOSIS — M25562 Pain in left knee: Secondary | ICD-10-CM

## 2018-04-21 MED ORDER — MELOXICAM 7.5 MG PO TABS: 7.5000 mg | ORAL_TABLET | Freq: Every day | ORAL | 0 refills | Status: DC

## 2018-04-21 MED ORDER — METHOCARBAMOL 500 MG PO TABS
500.0000 mg | ORAL_TABLET | Freq: Two times a day (BID) | ORAL | 0 refills | Status: AC
Start: 2018-04-21 — End: ?
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
## 2018-04-26 ENCOUNTER — Telehealth: Payer: Self-pay | Admitting: Physician Assistant

## 2018-04-26 NOTE — Telephone Encounter (Signed)
Please review. Thanks!  

## 2018-04-26 NOTE — Telephone Encounter (Signed)
Forms have been completed. L/M stating that they were faxed today, and a copy is left up front to pick up.

## 2018-04-26 NOTE — Telephone Encounter (Signed)
Pt dropped of [employer] leave forms and request a call back once forms are completed. Pt stated that she has to have them turned in by 05/04/18 and would need them prior to that date. Forms placed in provider's box. Please advise. Thanks TNP

## 2018-04-28 ENCOUNTER — Ambulatory Visit: Payer: BLUE CROSS/BLUE SHIELD | Attending: Physician Assistant

## 2018-04-28 DIAGNOSIS — R262 Difficulty in walking, not elsewhere classified: Secondary | ICD-10-CM | POA: Insufficient documentation

## 2018-04-28 DIAGNOSIS — M25562 Pain in left knee: Secondary | ICD-10-CM | POA: Insufficient documentation

## 2018-04-28 DIAGNOSIS — M542 Cervicalgia: Secondary | ICD-10-CM | POA: Insufficient documentation

## 2018-04-28 DIAGNOSIS — M79652 Pain in left thigh: Secondary | ICD-10-CM | POA: Diagnosis present

## 2018-04-28 DIAGNOSIS — M6281 Muscle weakness (generalized): Secondary | ICD-10-CM | POA: Diagnosis present

## 2018-04-28 DIAGNOSIS — M79605 Pain in left leg: Secondary | ICD-10-CM

## 2018-04-28 DIAGNOSIS — M25512 Pain in left shoulder: Secondary | ICD-10-CM | POA: Diagnosis present

## 2018-04-28 DIAGNOSIS — M25552 Pain in left hip: Secondary | ICD-10-CM | POA: Insufficient documentation

## 2018-04-28 DIAGNOSIS — M545 Low back pain: Secondary | ICD-10-CM | POA: Diagnosis not present

## 2018-04-28 NOTE — Patient Instructions (Addendum)
  Sitting on a chair    Press your hands on your thighs to feel your abdominal muscles contract.   Hold for 5 seconds comfortably.   Repeat 5 times.   Perform 6 sets daily.      Also gave sitting with lumbar towel roll as part of her HEP. Pt demonstrated and verbalized understanding.
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
## 2018-05-05 ENCOUNTER — Ambulatory Visit: Payer: BLUE CROSS/BLUE SHIELD

## 2018-05-05 DIAGNOSIS — M545 Low back pain: Secondary | ICD-10-CM

## 2018-05-05 DIAGNOSIS — M542 Cervicalgia: Secondary | ICD-10-CM

## 2018-05-05 DIAGNOSIS — M79605 Pain in left leg: Secondary | ICD-10-CM

## 2018-05-05 DIAGNOSIS — M6281 Muscle weakness (generalized): Secondary | ICD-10-CM

## 2018-05-05 DIAGNOSIS — M25552 Pain in left hip: Secondary | ICD-10-CM

## 2018-05-05 DIAGNOSIS — R262 Difficulty in walking, not elsewhere classified: Secondary | ICD-10-CM

## 2018-05-05 DIAGNOSIS — M79652 Pain in left thigh: Secondary | ICD-10-CM

## 2018-05-05 DIAGNOSIS — M25512 Pain in left shoulder: Secondary | ICD-10-CM

## 2018-05-05 DIAGNOSIS — M25562 Pain in left knee: Secondary | ICD-10-CM
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
## 2018-05-11 ENCOUNTER — Ambulatory Visit: Payer: BLUE CROSS/BLUE SHIELD | Attending: Physician Assistant

## 2018-05-11 DIAGNOSIS — M25562 Pain in left knee: Secondary | ICD-10-CM | POA: Diagnosis present

## 2018-05-11 DIAGNOSIS — R262 Difficulty in walking, not elsewhere classified: Secondary | ICD-10-CM | POA: Diagnosis present

## 2018-05-11 DIAGNOSIS — M79605 Pain in left leg: Secondary | ICD-10-CM | POA: Diagnosis present

## 2018-05-11 DIAGNOSIS — M79652 Pain in left thigh: Secondary | ICD-10-CM

## 2018-05-11 DIAGNOSIS — M545 Low back pain: Secondary | ICD-10-CM | POA: Diagnosis present

## 2018-05-11 DIAGNOSIS — M25552 Pain in left hip: Secondary | ICD-10-CM | POA: Insufficient documentation

## 2018-05-11 DIAGNOSIS — M542 Cervicalgia: Secondary | ICD-10-CM | POA: Diagnosis present

## 2018-05-11 DIAGNOSIS — M6281 Muscle weakness (generalized): Secondary | ICD-10-CM | POA: Diagnosis present

## 2018-05-11 DIAGNOSIS — M25512 Pain in left shoulder: Secondary | ICD-10-CM | POA: Insufficient documentation
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
## 2018-05-11 NOTE — Patient Instructions (Addendum)
Medbridge Access Code: [email or web address]    Doorway Pec Stretch at 60 Degrees Abduction  1-3 sets of 10 with 10 second holds daily.    Also gave L rhomboid stretch (gently pulling L arm into adduction across body at waist height) 1-3 sets of 10 with 10 seconds small to medium stretch comfortably.

## 2018-05-13 ENCOUNTER — Ambulatory Visit: Payer: BLUE CROSS/BLUE SHIELD

## 2018-05-13 DIAGNOSIS — M25552 Pain in left hip: Secondary | ICD-10-CM

## 2018-05-13 DIAGNOSIS — M545 Low back pain: Secondary | ICD-10-CM | POA: Diagnosis not present

## 2018-05-13 DIAGNOSIS — M542 Cervicalgia: Secondary | ICD-10-CM

## 2018-05-13 DIAGNOSIS — R262 Difficulty in walking, not elsewhere classified: Secondary | ICD-10-CM

## 2018-05-13 DIAGNOSIS — M79605 Pain in left leg: Secondary | ICD-10-CM

## 2018-05-13 DIAGNOSIS — M79652 Pain in left thigh: Secondary | ICD-10-CM

## 2018-05-13 DIAGNOSIS — M6281 Muscle weakness (generalized): Secondary | ICD-10-CM

## 2018-05-13 DIAGNOSIS — M25562 Pain in left knee: Secondary | ICD-10-CM

## 2018-05-13 DIAGNOSIS — M25512 Pain in left shoulder: Secondary | ICD-10-CM
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
## 2018-05-13 NOTE — Patient Instructions (Addendum)
Medbridge Access Code: [email or web address]   Standing Hip Extension Kicks   10x3 each LE with yellow band     Standing LE leg press resisting blue band with bilateral UE assist 10x2 each LE

## 2018-05-14 ENCOUNTER — Encounter: Payer: Self-pay | Admitting: Emergency Medicine

## 2018-05-14 DIAGNOSIS — R1110 Vomiting, unspecified: Secondary | ICD-10-CM | POA: Diagnosis not present

## 2018-05-14 DIAGNOSIS — Z5321 Procedure and treatment not carried out due to patient leaving prior to being seen by health care provider: Secondary | ICD-10-CM | POA: Insufficient documentation

## 2018-05-14 DIAGNOSIS — R1032 Left lower quadrant pain: Secondary | ICD-10-CM | POA: Insufficient documentation

## 2018-05-14 DIAGNOSIS — N3289 Other specified disorders of bladder: Secondary | ICD-10-CM | POA: Diagnosis not present

## 2018-05-14 LAB — URINALYSIS, COMPLETE (UACMP) WITH MICROSCOPIC
Bilirubin Urine: NEGATIVE
Glucose, UA: NEGATIVE mg/dL
KETONES UR: NEGATIVE mg/dL
NITRITE: POSITIVE — AB
PH: 5 (ref 5.0–8.0)
PROTEIN: 30 mg/dL — AB
Specific Gravity, Urine: 1.014 (ref 1.005–1.030)
WBC, UA: >50 WBC/hpf — ABNORMAL HIGH (ref 0–5)

## 2018-05-14 LAB — CBC
HEMATOCRIT: 41 % (ref 35.0–47.0)
Hemoglobin: 14.4 g/dL (ref 12.0–16.0)
MCH: 31.1 pg (ref 26.0–34.0)
MCHC: 35.1 g/dL (ref 32.0–36.0)
MCV: 88.5 fL (ref 80.0–100.0)
Platelets: 261 10*3/uL (ref 150–440)
RBC: 4.63 MIL/uL (ref 3.80–5.20)
RDW: 13.7 % (ref 11.5–14.5)
WBC: 9 10*3/uL (ref 3.6–11.0)

## 2018-05-14 LAB — COMPREHENSIVE METABOLIC PANEL
ALBUMIN: 4.2 g/dL (ref 3.5–5.0)
ALT: 15 U/L (ref 14–54)
ANION GAP: 10 (ref 5–15)
AST: 18 U/L (ref 15–41)
Alkaline Phosphatase: 71 U/L (ref 38–126)
BILIRUBIN TOTAL: 1.1 mg/dL (ref 0.3–1.2)
BUN: 11 mg/dL (ref 6–20)
CO2: 22 mmol/L (ref 22–32)
Calcium: 9 mg/dL (ref 8.9–10.3)
Chloride: 105 mmol/L (ref 101–111)
Creatinine, Ser: 0.79 mg/dL (ref 0.44–1.00)
GFR calc Af Amer: >60 mL/min (ref >60)
GFR calc non Af Amer: >60 mL/min (ref >60)
GLUCOSE: 102 mg/dL — AB (ref 65–99)
Potassium: 3.5 mmol/L (ref 3.5–5.1)
SODIUM: 137 mmol/L (ref 135–145)
TOTAL PROTEIN: 7.3 g/dL (ref 6.5–8.1)

## 2018-05-14 LAB — LIPASE, BLOOD: Lipase: 33 U/L (ref 11–51)

## 2018-05-14 NOTE — ED Triage Notes (Signed)
Patient with complaint of left flank pain and bladder spasms times one week. Patient reports vomiting times two today.

## 2018-05-15 ENCOUNTER — Emergency Department
Admission: EM | Admit: 2018-05-15 | Discharge: 2018-05-15 | Disposition: A | Discharge status: Home / Self Care | Payer: BLUE CROSS/BLUE SHIELD | Source: Home / Self Care | Attending: Emergency Medicine | Admitting: Emergency Medicine

## 2018-05-15 ENCOUNTER — Other Ambulatory Visit: Payer: Self-pay

## 2018-05-15 ENCOUNTER — Emergency Department: Payer: BLUE CROSS/BLUE SHIELD

## 2018-05-15 ENCOUNTER — Emergency Department
Admission: EM | Admit: 2018-05-15 | Discharge: 2018-05-15 | Disposition: A | Discharge status: Home / Self Care | Payer: BLUE CROSS/BLUE SHIELD | Attending: Emergency Medicine | Admitting: Emergency Medicine

## 2018-05-15 DIAGNOSIS — N132 Hydronephrosis with renal and ureteral calculous obstruction: Secondary | ICD-10-CM | POA: Insufficient documentation

## 2018-05-15 DIAGNOSIS — N390 Urinary tract infection, site not specified: Secondary | ICD-10-CM | POA: Insufficient documentation

## 2018-05-15 DIAGNOSIS — F17210 Nicotine dependence, cigarettes, uncomplicated: Secondary | ICD-10-CM | POA: Insufficient documentation

## 2018-05-15 DIAGNOSIS — Z79899 Other long term (current) drug therapy: Secondary | ICD-10-CM

## 2018-05-15 LAB — PREGNANCY, URINE: Preg Test, Ur: NEGATIVE

## 2018-05-15 LAB — URINALYSIS, COMPLETE (UACMP) WITH MICROSCOPIC
Bilirubin Urine: NEGATIVE
GLUCOSE, UA: NEGATIVE mg/dL
Ketones, ur: 5 mg/dL — AB
NITRITE: POSITIVE — AB
PROTEIN: NEGATIVE mg/dL
Specific Gravity, Urine: 1.009 (ref 1.005–1.030)
pH: 6 (ref 5.0–8.0)

## 2018-05-15 MED ORDER — OXYCODONE-ACETAMINOPHEN 7.5-325 MG PO TABS: 1.0000 | ORAL_TABLET | Freq: Four times a day (QID) | ORAL | 0 refills | Status: DC | PRN

## 2018-05-15 MED ORDER — MORPHINE SULFATE (PF) 4 MG/ML IV SOLN
4.0000 mg | Freq: Once | INTRAVENOUS | Status: AC
  Administered 2018-05-15: 4 mg via INTRAVENOUS
  Filled 2018-05-15: qty 1

## 2018-05-15 MED ORDER — SODIUM CHLORIDE 0.9 % IV BOLUS
1000.0000 mL | Freq: Once | INTRAVENOUS | Status: AC
  Administered 2018-05-15: 1000 mL via INTRAVENOUS

## 2018-05-15 MED ORDER — KETOROLAC TROMETHAMINE 30 MG/ML IJ SOLN
30.0000 mg | Freq: Once | INTRAMUSCULAR | Status: AC
  Administered 2018-05-15: 30 mg via INTRAVENOUS
  Filled 2018-05-15: qty 1

## 2018-05-15 MED ORDER — SULFAMETHOXAZOLE-TRIMETHOPRIM 800-160 MG PO TABS: 1.0000 | ORAL_TABLET | Freq: Two times a day (BID) | ORAL | 0 refills | Status: DC

## 2018-05-15 NOTE — ED Notes (Signed)
Returned from CT.

## 2018-05-15 NOTE — ED Triage Notes (Signed)
States was here last night and had blood drawn and urine sample obtained but left before she was seen. The urine last night showed uti.states has urologist for the chronic uti's.

## 2018-05-15 NOTE — ED Provider Notes (Signed)
[facility] Emergency Department Provider Note   ____________________________________________   First MD Initiated Contact with Patient 05/15/18 1236     (approximate)  I have reviewed the triage vital signs and the nursing notes.   HISTORY  Chief Complaint Abdominal Pain    HPI Kathleen Hutchinson is a 32 y.o. female patient presents for left flank and abdominal pain.  Patient also complained of urinary bladder spasms for 1 week.  Patient came to the ED last night had labs drawn with left follow-up being seen.  Patient said pain has increased in the last 12 hours.  Patient has a history of hydronephrosis and recurrent UTIs.  Patient denies fever/chills.  Patient denies vaginal discharge.  Past Medical History:  Diagnosis Date  . Headaches, cluster   . History of kidney stones   . Nephrolithiasis     Patient Active Problem List   Diagnosis Date Noted  . Flank pain   . Sepsis (HCC) 12/02/2017  . Acute pyelonephritis 12/02/2017  . Ureteral stricture, left 07/27/2017  . UTI (urinary tract infection) 05/01/2017  . Hydronephrosis concurrent with and due to ureteral stricture 05/01/2017  . Left nephrolithiasis 05/01/2017    Past Surgical History:  Procedure Laterality Date  . CESAREAN SECTION    . CYSTOSCOPY W/ RETROGRADES Left 05/01/2017   Procedure: CYSTOSCOPY WITH RETROGRADE PYELOGRAM;  Surgeon: Bjorn Pippin, MD;  Location: [facility];  Service: Urology;  Laterality: Left;  . CYSTOSCOPY W/ RETROGRADES Left 07/06/2017   Procedure: CYSTOSCOPY WITH RETROGRADE PYELOGRAM;  Surgeon: Vanna Scotland, MD;  Location: [facility];  Service: Urology;  Laterality: Left;  . CYSTOSCOPY W/ URETERAL STENT PLACEMENT Left 07/06/2017   Procedure: CYSTOSCOPY WITH STENT REPLACEMENT;  Surgeon: Vanna Scotland, MD;  Location: [facility];  Service: Urology;  Laterality: Left;  . CYSTOSCOPY WITH STENT PLACEMENT Left 05/01/2017   Procedure: CYSTOSCOPY WITH STENT PLACEMENT;  Surgeon:  Bjorn Pippin, MD;  Location: [facility];  Service: Urology;  Laterality: Left;  . CYSTOSCOPY/URETEROSCOPY/HOLMIUM LASER/STENT PLACEMENT Left 10/02/2016  . ROBOTICALLY ASSISTED LAPAROSCOPIC URETERAL RE-IMPLANTATION Left 07/27/2017   Procedure: ROBOTICALLY ASSISTED LAPAROSCOPIC URETERAL RE-IMPLANTATION;  Surgeon: Vanna Scotland, MD;  Location: [facility];  Service: Urology;  Laterality: Left;  . TUBAL LIGATION    . URETEROSCOPY WITH HOLMIUM LASER LITHOTRIPSY Left 07/06/2017   Procedure: URETEROSCOPY WITH HOLMIUM LASER LITHOTRIPSY;  Surgeon: Vanna Scotland, MD;  Location: [facility];  Service: Urology;  Laterality: Left;  Marland Kitchen VAGINAL DELIVERY     x 4    Prior to Admission medications   Medication Sig Start Date End Date Taking? Authorizing Provider  acetaminophen (TYLENOL) 325 MG tablet Take 650 mg by mouth every 6 (six) hours as needed for mild pain.     [provider]  albuterol (PROVENTIL HFA;VENTOLIN HFA) 108 (90 Base) MCG/ACT inhaler Inhale into the lungs every 6 (six) hours as needed for wheezing or shortness of breath.    [provider]  ciprofloxacin (CIPRO) 500 MG tablet TK 1  T PO BID 04/10/18   [provider]  cyclobenzaprine (FLEXERIL) 10 MG tablet Take 10 mg by mouth 3 (three) times daily. 04/12/18   [provider]  diazepam (VALIUM) 2 MG tablet Take 1 tablet (2 mg total) by mouth every 8 (eight) hours as needed for muscle spasms. 04/12/18   Tommi Rumps, PA-C  diphenhydrAMINE (BENADRYL) 25 MG tablet Take 25 mg by mouth daily as needed for allergies.    [provider]  diphenhydramine-acetaminophen (TYLENOL PM) 25-500 MG  TABS tablet Take 2 tablets by mouth at bedtime as needed (sleep).    [provider]  ibuprofen (ADVIL,MOTRIN) 200 MG tablet Take 200 mg by mouth every 6 (six) hours as needed.    [provider]  ibuprofen (ADVIL,MOTRIN) 800 MG tablet Take 1 tablet (800 mg total) by mouth every 8 (eight) hours as needed for  moderate pain. 04/07/18   Joni Reining, PA-C  ketorolac (TORADOL) 10 MG tablet Take 1 tablet (10 mg total) by mouth every 6 (six) hours as needed for moderate pain. 04/12/18   Tommi Rumps, PA-C  meloxicam (MOBIC) 7.5 MG tablet Take 1 tablet (7.5 mg total) by mouth daily. Patient not taking: Reported on 04/28/2018 04/21/18   Trey Sailors, PA-C  methocarbamol (ROBAXIN) 500 MG tablet Take 1 tablet (500 mg total) by mouth 2 (two) times daily. Patient not taking: Reported on 04/28/2018 04/21/18   Trey Sailors, PA-C  Multiple Vitamin (MULTIVITAMIN) tablet Take 1 tablet by mouth daily.    [provider]  oxyCODONE-acetaminophen (PERCOCET) 7.5-325 MG tablet Take 1 tablet by mouth every 6 (six) hours as needed for severe pain. 05/15/18   Joni Reining, PA-C  sulfamethoxazole-trimethoprim (BACTRIM DS,SEPTRA DS) 800-160 MG tablet Take 1 tablet by mouth 2 (two) times daily. 05/15/18   Joni Reining, PA-C  traMADol (ULTRAM) 50 MG tablet Take 50 mg by mouth every 6 (six) hours as needed. 04/12/18   [provider]    Allergies Augmentin [amoxicillin-pot clavulanate]  Family History  Problem Relation Age of Onset  . Cholecystitis Mother   . Atrial fibrillation Father   . Nephrolithiasis Neg Hx   . Migraines Neg Hx     Social History Social History   Tobacco Use  . Smoking status: Current Every Day Smoker    Packs/day: 0.25    Years: 10.00    Pack years: 2.50    Types: Cigarettes  . Smokeless tobacco: Never Used  Substance Use Topics  . Alcohol use: Yes    Comment: occ  . Drug use: Yes    Types: Marijuana    Comment: EVERYDAY    Review of Systems Constitutional: No fever/chills Eyes: No visual changes. ENT: No sore throat. Cardiovascular: Denies chest pain. Respiratory: Denies shortness of breath. Gastrointestinal: Left lower abdominal pain.  No nausea, no vomiting.  No diarrhea.  No constipation. Genitourinary: Positive for dysuria. Musculoskeletal:  Positive for left flank pain.. Skin: Negative for rash. Neurological: Negative for headaches, focal weakness or numbness. Allergic/Immunilogical: Augmentin ____________________________________________   PHYSICAL EXAM:  VITAL SIGNS: ED Triage Vitals  Enc Vitals Group     BP 05/15/18 1214 115/79     Pulse Rate 05/15/18 1214 92     Resp 05/15/18 1214 16     Temp 05/15/18 1214 98.2 F (36.8 C)     Temp src --      SpO2 05/15/18 1214 98 %     Weight 05/15/18 1215 212 lb (96.2 kg)     Height 05/15/18 1215 5\' 8"  (1.727 m)     Head Circumference --      Peak Flow --      Pain Score --      Pain Loc --      Pain Edu? --      Excl. in GC? --    Constitutional: Alert and oriented.  Moderate distress.   Neck: No stridor.   Cardiovascular: Normal rate, regular rhythm. Grossly normal heart sounds.  Good peripheral  circulation. Respiratory: Normal respiratory effort.  No retractions. Lungs CTAB. Gastrointestinal: Soft tender to palpation left lower abdomen. No distention. No abdominal bruits.  Left CVA guarding with palpation. Genitourinary: Deferred ____________________________________________   LABS (all labs ordered are listed, but only abnormal results are displayed)  Labs Reviewed  URINALYSIS, COMPLETE (UACMP) WITH MICROSCOPIC - Abnormal; Notable for the following components:      Result Value   Color, Urine YELLOW (*)    APPearance HAZY (*)    Hgb urine dipstick SMALL (*)    Ketones, ur 5 (*)    Nitrite POSITIVE (*)    Leukocytes, UA MODERATE (*)    WBC, UA >50 (*)    Bacteria, UA FEW (*)    All other components within normal limits  URINE CULTURE  PREGNANCY, URINE   ____________________________________________  EKG   ____________________________________________  RADIOLOGY  ED MD interpretation:    Official radiology report(s): Ct Renal Stone Study  Result Date: 05/15/2018 CLINICAL DATA:  Left flank pain for 1 week EXAM: CT ABDOMEN AND PELVIS WITHOUT CONTRAST  TECHNIQUE: Multidetector CT imaging of the abdomen and pelvis was performed following the standard protocol without IV contrast. COMPARISON:  December 02, 2017 FINDINGS: Lower chest: No acute abnormality. Hepatobiliary: No focal liver abnormality is seen. No gallstones, gallbladder wall thickening, or biliary dilatation. Pancreas: Unremarkable. No pancreatic ductal dilatation or surrounding inflammatory changes. Spleen: Normal in size without focal abnormality. Adrenals/Urinary Tract: The bilateral adrenal glands are normal. The right kidney is normal. There is no right hydronephrosis. There is left hydroureteronephrosis without focal discrete obstructing stone noted in the expected course of the left ureter. There are nonobstructing stones within the left kidney. The bladder is normal. Stomach/Bowel: Stomach is within normal limits. Appendix appears normal. No evidence of bowel wall thickening, distention, or inflammatory changes. Vascular/Lymphatic: No significant vascular findings are present. No enlarged abdominal or pelvic lymph nodes. Reproductive: Uterus and bilateral adnexa are unremarkable. Other: None. Musculoskeletal: No acute or significant osseous findings. IMPRESSION: Left hydroureteronephrosis without focal discrete obstructing stone identified in the expected course of the left ureter. Nonobstructing stones are identified within the left kidney. Electronically Signed   By: Sherian ReinWei-Chen  Lin M.D.   On: 05/15/2018 15:33    ____________________________________________   PROCEDURES  Procedure(s) performed:   Procedures  Critical Care performed:   ____________________________________________   INITIAL IMPRESSION / ASSESSMENT AND PLAN / ED COURSE  As part of my medical decision making, I reviewed the following data within the electronic MEDICAL RECORD NUMBER    Left flank pain with dysuria and hematuria.  Discussed CT findings with patient showing nonobstructive stones with left  hydronephrosis.  Patient given discharge care instruction advised take medication as directed.  Patient advised to follow-up with her treating neurologist for definitive evaluation.      ____________________________________________   FINAL CLINICAL IMPRESSION(S) / ED DIAGNOSES  Final diagnoses:  Lower urinary tract infectious disease  Hydronephrosis concurrent with and due to calculi of kidney and ureter     ED Discharge Orders        Ordered    sulfamethoxazole-trimethoprim (BACTRIM DS,SEPTRA DS) 800-160 MG tablet  2 times daily     05/15/18 1545    oxyCODONE-acetaminophen (PERCOCET) 7.5-325 MG tablet  Every 6 hours PRN     05/15/18 1545       Note:  This document was prepared using Dragon voice recognition software and may include unintentional dictation errors.    Joni ReiningSmith, Nyrah Demos K, PA-C 05/15/18 1549  Merrily Brittle, MD 05/16/18 1024

## 2018-05-15 NOTE — ED Triage Notes (Signed)
First Nurse Note:  C/O left flank pain and bladder spasm x  week.  Presented to ED last night for same complaint, but LWBS.  Labs done last night and results are available.    AAOx3.  Skin warm and dry. NAD

## 2018-05-17 ENCOUNTER — Telehealth: Payer: Self-pay | Admitting: Physician Assistant

## 2018-05-17 LAB — URINE CULTURE

## 2018-05-17 NOTE — Telephone Encounter (Signed)
Pt stated that she dropped off forms on 05/07/18 to be completed so she can miss work for her physical therapy appts. Pt stated [employer] advised they haven't received forms. Please advise. Thanks TNP

## 2018-05-18 ENCOUNTER — Ambulatory Visit: Payer: BLUE CROSS/BLUE SHIELD

## 2018-05-18 DIAGNOSIS — M79605 Pain in left leg: Secondary | ICD-10-CM

## 2018-05-18 DIAGNOSIS — M25562 Pain in left knee: Secondary | ICD-10-CM

## 2018-05-18 DIAGNOSIS — M545 Low back pain: Secondary | ICD-10-CM

## 2018-05-18 DIAGNOSIS — M542 Cervicalgia: Secondary | ICD-10-CM

## 2018-05-18 DIAGNOSIS — M6281 Muscle weakness (generalized): Secondary | ICD-10-CM

## 2018-05-18 DIAGNOSIS — M79652 Pain in left thigh: Secondary | ICD-10-CM

## 2018-05-18 DIAGNOSIS — M25512 Pain in left shoulder: Secondary | ICD-10-CM

## 2018-05-18 DIAGNOSIS — R262 Difficulty in walking, not elsewhere classified: Secondary | ICD-10-CM

## 2018-05-18 DIAGNOSIS — M25552 Pain in left hip: Secondary | ICD-10-CM

## 2018-05-18 NOTE — Patient Instructions (Addendum)
Upgraded standing LE leg press to double blue band for HEP   Standing bilateral low rows resisting red band 10x5 seconds for 3 sets   Reviewed and given as part of her HEP. Pt demonstrated and verbalized understanding.
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
## 2018-05-18 NOTE — Progress Notes (Signed)
ED Antimicrobial Stewardship Positive Culture Follow Up   Kathleen Hutchinson is an 32 y.o. female who [facility] to [facility]2018 with a chief complaint of abdominal pain. UA positive nitrite, moderate leukocytes and WBC >50. Discharged on Bactrim and urine culture resulted with >=100,000 colonies E. Faecalis.   [x]  Treated with Bactrim, organism resistant to prescribed antimicrobial  New antibiotic prescription: Macrobid 100 mg twice daily x 7 days  Spoke with patient and discussed stopping Bactrim, starting Macrobid and completing entire course of abx. Patient stated Bactrim was causing a lot of stomach upset even when taking after eating. Called new Macrobid prescription into [facility] on [address], [place], [place].     ED Provider: Lenard LancePaduchowski    Recent Results (from the past 720 hour(s))  Urine culture     Status: Abnormal   Collection Time: 05/15/18 12:21 PM  Result Value Ref Range Status   Specimen Description   Final    URINE, RANDOM Performed at [facility], [address] [place], [place] [address]    Special Requests   Final    NONE Performed at [facility], [address] [place], [place] [address]    Culture >=100,000 COLONIES/mL ENTEROCOCCUS FAECALIS (A)  Final   Report Status 05/17/2018 FINAL  Final   Organism ID, Bacteria ENTEROCOCCUS FAECALIS (A)  Final      Susceptibility   Enterococcus faecalis - MIC*    AMPICILLIN <=2 SENSITIVE Sensitive     LEVOFLOXACIN >=8 RESISTANT Resistant     NITROFURANTOIN <=16 SENSITIVE Sensitive     VANCOMYCIN 1 SENSITIVE Sensitive     * >=100,000 COLONIES/mL ENTEROCOCCUS FAECALIS     Kathleen Hutchinson, PharmD Pharmacy Resident  05/18/2018, 10:49 AM

## 2018-05-19 ENCOUNTER — Ambulatory Visit: Payer: BLUE CROSS/BLUE SHIELD

## 2018-05-21 ENCOUNTER — Telehealth: Payer: Self-pay | Admitting: Physician Assistant

## 2018-05-21 NOTE — Telephone Encounter (Signed)
Pt called saying she needed to pick up the second set of paper work that she dropped off that is for the physical therapy.  Every page has to be filled out and signed and they need dates for therapy.  She ended therapy on the 11th of June.  Pt's states the first set she dropped off wasn't compete?  Pt's call back is [phone]   Barth Kirkseri

## 2018-05-21 NOTE — Telephone Encounter (Signed)
Please review

## 2018-05-21 NOTE — Telephone Encounter (Signed)
I'll be back Tuesday to fill it out. I had already filled it out and I'm not sure why it didn't go through to [place].

## 2018-05-24 NOTE — Telephone Encounter (Signed)
FYI...   Pt knows you will be in the office tomorrow (05/25/2018)  Thanks,   -Vernona RiegerLaura

## 2018-05-25 ENCOUNTER — Ambulatory Visit: Payer: BLUE CROSS/BLUE SHIELD

## 2018-05-25 ENCOUNTER — Ambulatory Visit (INDEPENDENT_AMBULATORY_CARE_PROVIDER_SITE_OTHER): Payer: BLUE CROSS/BLUE SHIELD | Admitting: Urology

## 2018-05-25 ENCOUNTER — Encounter: Payer: Self-pay | Admitting: Radiology

## 2018-05-25 ENCOUNTER — Encounter: Payer: Self-pay | Admitting: Urology

## 2018-05-25 VITALS — BP 122/60 | HR 88 | Ht 68.0 in | Wt 207.5 lb

## 2018-05-25 DIAGNOSIS — N390 Urinary tract infection, site not specified: Secondary | ICD-10-CM

## 2018-05-25 DIAGNOSIS — N1330 Unspecified hydronephrosis: Secondary | ICD-10-CM

## 2018-05-25 DIAGNOSIS — N200 Calculus of kidney: Secondary | ICD-10-CM | POA: Diagnosis not present

## 2018-05-25 LAB — URINALYSIS, COMPLETE
Bilirubin, UA: NEGATIVE
Glucose, UA: NEGATIVE
KETONES UA: NEGATIVE
LEUKOCYTES UA: NEGATIVE
NITRITE UA: NEGATIVE
RBC UA: NEGATIVE
SPEC GRAV UA: 1.02 (ref 1.005–1.030)
Urobilinogen, Ur: 0.2 mg/dL (ref 0.2–1.0)
pH, UA: 5.5 (ref 5.0–7.5)

## 2018-05-25 LAB — MICROSCOPIC EXAMINATION: RBC, UA: NONE SEEN /hpf (ref 0–2)

## 2018-05-25 MED ORDER — NITROFURANTOIN MONOHYD MACRO 100 MG PO CAPS: 100.0000 mg | ORAL_CAPSULE | Freq: Two times a day (BID) | ORAL | 0 refills | Status: DC

## 2018-05-25 NOTE — Progress Notes (Signed)
05/25/2018 8:41 AM   Kathleen Hutchinson [birth year] [id number]  Referring provider: Trey Sailors, PA-C [address] [place], [place] [address]  Chief Complaint  Patient presents with  . Hospitalization Follow-up  . 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  She was seen and evaluated in the emergency room on 05/15/2018 with left flank pain, bladder spasms, and a positive urinalysis concerning for infection.  She was treated with Macrobid which she continues today.  Since starting the antibiotic, she has had some improvement but not complete resolution of her symptoms.  No fevers or chills.  No nausea or vomiting.  As part of her work-up in the emergency room, she did have another noncontrast repeat CT scan which showed chronic left hydroureteronephrosis without an obstructing stone along the course of the ureter.  She had nonobstructing stones in the left kidney which are relatively small.  UCx from 05/15/2018 grew Enterococcus resistant to Levaquin.  She is status post left robotic left distal ureter reimplant on 07/06/2018.  Preoperative Lasix renogram showed excellent left renal function.  The stricture was result of retained/impacted ureteral stone.  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  Most recent follow-up Lasix renogram on 02/08/2018 normal washout of radiotracer after diuretic administration signifying lack of significant urinary outflow obstruction.    PMH: Past Medical History:  Diagnosis Date  . Headaches, cluster   . History of kidney stones   . Nephrolithiasis     Surgical History: Past Surgical History:  Procedure Laterality Date  . CESAREAN SECTION    . CYSTOSCOPY W/ RETROGRADES Left 05/01/2017   Procedure: CYSTOSCOPY WITH RETROGRADE PYELOGRAM;  Surgeon: Bjorn Pippin, MD;  Location: [facility];  Service: Urology;  Laterality: Left;  . CYSTOSCOPY W/ RETROGRADES Left 07/06/2017   Procedure: CYSTOSCOPY WITH RETROGRADE PYELOGRAM;  Surgeon: Vanna Scotland, MD;  Location: [facility];  Service: Urology;  Laterality: Left;  . CYSTOSCOPY W/ URETERAL STENT PLACEMENT Left 07/06/2017   Procedure: CYSTOSCOPY WITH STENT REPLACEMENT;  Surgeon: Vanna Scotland, MD;  Location: [facility];  Service: Urology;  Laterality: Left;  . CYSTOSCOPY WITH STENT PLACEMENT Left 05/01/2017   Procedure: CYSTOSCOPY WITH STENT PLACEMENT;  Surgeon: Bjorn Pippin, MD;  Location: [facility];  Service: Urology;  Laterality: Left;  . CYSTOSCOPY/URETEROSCOPY/HOLMIUM LASER/STENT PLACEMENT Left 10/02/2016  . ROBOTICALLY ASSISTED LAPAROSCOPIC URETERAL RE-IMPLANTATION Left 07/27/2017   Procedure: ROBOTICALLY ASSISTED LAPAROSCOPIC URETERAL RE-IMPLANTATION;  Surgeon: Vanna Scotland, MD;  Location: [facility];  Service: Urology;  Laterality: Left;  . TUBAL LIGATION    . URETEROSCOPY WITH HOLMIUM LASER LITHOTRIPSY Left 07/06/2017   Procedure: URETEROSCOPY WITH HOLMIUM LASER LITHOTRIPSY;  Surgeon: Vanna Scotland, MD;  Location: [facility];  Service: Urology;  Laterality: Left;  Marland Kitchen VAGINAL DELIVERY     x 4    Home Medications:  Allergies as of 05/25/2018      Reactions   Augmentin [amoxicillin-pot Clavulanate] Rash   Has patient had a PCN reaction causing immediate rash, facial/tongue/throat swelling, SOB or lightheadedness with hypotension: Yes Has patient had a PCN reaction causing severe rash involving mucus membranes or skin necrosis: Yes Has patient had a PCN reaction that required hospitalization: No Has patient had a PCN reaction occurring within the last 10 years: Yes If all of the above answers are "NO", then may proceed with Cephalosporin use.  Medication List        Accurate as of 05/25/18 11:59 PM. Always use your most recent med list.          acetaminophen 325 MG tablet Commonly known  as:  TYLENOL Take 650 mg by mouth every 6 (six) hours as needed for mild pain.   albuterol 108 (90 Base) MCG/ACT inhaler Commonly known as:  PROVENTIL HFA;VENTOLIN HFA Inhale into the lungs every 6 (six) hours as needed for wheezing or shortness of breath.   cyclobenzaprine 10 MG tablet Commonly known as:  FLEXERIL Take 10 mg by mouth 3 (three) times daily.   diazepam 2 MG tablet Commonly known as:  VALIUM Take 1 tablet (2 mg total) by mouth every 8 (eight) hours as needed for muscle spasms.   diphenhydrAMINE 25 MG tablet Commonly known as:  BENADRYL Take 25 mg by mouth daily as needed for allergies.   diphenhydramine-acetaminophen 25-500 MG Tabs tablet Commonly known as:  TYLENOL PM Take 2 tablets by mouth at bedtime as needed (sleep).   ibuprofen 800 MG tablet Commonly known as:  ADVIL,MOTRIN Take 1 tablet (800 mg total) by mouth every 8 (eight) hours as needed for moderate pain.   ketorolac 10 MG tablet Commonly known as:  TORADOL Take 1 tablet (10 mg total) by mouth every 6 (six) hours as needed for moderate pain.   meloxicam 7.5 MG tablet Commonly known as:  MOBIC Take 1 tablet (7.5 mg total) by mouth daily.   methocarbamol 500 MG tablet Commonly known as:  ROBAXIN Take 1 tablet (500 mg total) by mouth 2 (two) times daily.   multivitamin tablet Take 1 tablet by mouth daily.   nitrofurantoin (macrocrystal-monohydrate) 100 MG capsule Commonly known as:  MACROBID TAKE 1 CAPSULE BY MOUTH TWICE DAILY FOR 7 DAYS   nitrofurantoin (macrocrystal-monohydrate) 100 MG capsule Commonly known as:  MACROBID Take 1 capsule (100 mg total) by mouth every 12 (twelve) hours.   oxyCODONE-acetaminophen 7.5-325 MG tablet Commonly known as:  PERCOCET Take 1 tablet by mouth every 6 (six) hours as needed for severe pain.   traMADol 50 MG tablet Commonly known as:  ULTRAM Take 50 mg by mouth every 6 (six) hours as needed.       Allergies:  Allergies  Allergen Reactions  .  Augmentin [Amoxicillin-Pot Clavulanate] Rash    Has patient had a PCN reaction causing immediate rash, facial/tongue/throat swelling, SOB or lightheadedness with hypotension: Yes Has patient had a PCN reaction causing severe rash involving mucus membranes or skin necrosis: Yes Has patient had a PCN reaction that required hospitalization: No Has patient had a PCN reaction occurring within the last 10 years: Yes If all of the above answers are "NO", then may proceed with Cephalosporin use.     Family History: Family History  Problem Relation Age of Onset  . Cholecystitis Mother   . Atrial fibrillation Father   . Nephrolithiasis Neg Hx   . Migraines Neg Hx     Social History:  reports that she has been smoking cigarettes.  She has a 2.50 pack-year smoking history. She has never used smokeless tobacco. She reports that she drinks alcohol. She reports that she has current or past drug history. Drug: Marijuana.  ROS: UROLOGY Frequent Urination?: No Hard to postpone urination?: No Burning/pain with urination?: No Get up at night to urinate?: No Leakage of urine?: Yes Urine stream starts and stops?: No Trouble starting stream?: No Do you have to strain to urinate?: No Blood in  urine?: No Urinary tract infection?: Yes Sexually transmitted disease?: No Injury to kidneys or bladder?: No Painful intercourse?: No Weak stream?: No Currently pregnant?: No Vaginal bleeding?: No Last menstrual period?: 05/19/2018  Gastrointestinal Nausea?: No Vomiting?: No Indigestion/heartburn?: No Diarrhea?: No Constipation?: No  Constitutional Fever: No Night sweats?: No Weight loss?: No Fatigue?: No  Skin Skin rash/lesions?: No Itching?: No  Eyes Blurred vision?: No Double vision?: No  Ears/Nose/Throat Sore throat?: No Sinus problems?: No  Hematologic/Lymphatic Swollen glands?: No Easy bruising?: No  Cardiovascular Leg swelling?: No Chest pain?: No  Respiratory Cough?:  No Shortness of breath?: No  Endocrine Excessive thirst?: No  Musculoskeletal Back pain?: No Joint pain?: No  Neurological Headaches?: No Dizziness?: No  Psychologic Depression?: No Anxiety?: No  Physical Exam: BP 122/60 (BP Location: Left Arm, Patient Position: Sitting, Cuff Size: Large)   Pulse 88   Ht 5\' 8"  (1.727 m)   Wt 207 lb 8 oz (94.1 kg)   LMP 05/18/2018   SpO2 99%   BMI 31.55 kg/m   Constitutional:  Alert and oriented, No acute distress. HEENT: [place] AT, moist mucus membranes.  Trachea midline, no masses. Cardiovascular: No clubbing, cyanosis, or edema. Respiratory: Normal respiratory effort, no increased work of breathing. GI: Abdomen is soft, nontender, nondistended, no abdominal masses, incisions well healed. GU: No CVA tenderness. Skin: No rashes, bruises or suspicious lesions. Neurologic: Grossly intact, no focal deficits, moving all 4 extremities. Psychiatric: Normal mood and affect.  Laboratory Data: Lab Results  Component Value Date   WBC 9.0 05/14/2018   HGB 14.4 05/14/2018   HCT 41.0 05/14/2018   MCV 88.5 05/14/2018   PLT 261 05/14/2018    Lab Results  Component Value Date   CREATININE 0.79 05/14/2018    Urinalysis Results for orders placed or performed in visit on 05/25/18  Microscopic Examination  Result Value Ref Range   WBC, UA 0-5 0 - 5 /hpf   RBC, UA None seen 0 - 2 /hpf   Epithelial Cells (non renal) 0-10 0 - 10 /hpf   Mucus, UA Present (A) Not Estab.   Bacteria, UA Moderate (A) None seen/Few  CULTURE, URINE COMPREHENSIVE  Result Value Ref Range   Urine Culture, Comprehensive Final report    Organism ID, Bacteria Comment   Urinalysis, Complete  Result Value Ref Range   Specific Gravity, UA 1.020 1.005 - 1.030   pH, UA 5.5 5.0 - 7.5   Color, UA Yellow Yellow   Appearance Ur Clear Clear   Leukocytes, UA Negative Negative   Protein, UA 1+ (A) Negative/Trace   Glucose, UA Negative Negative   Ketones, UA Negative Negative    RBC, UA Negative Negative   Bilirubin, UA Negative Negative   Urobilinogen, Ur 0.2 0.2 - 1.0 mg/dL   Nitrite, UA Negative Negative   Microscopic Examination See below:     Pertinent Imaging: Results for orders placed during the hospital encounter of 05/15/18  CT Renal Stone Study   Narrative CLINICAL DATA:  Left flank pain for 1 week  EXAM: CT ABDOMEN AND PELVIS WITHOUT CONTRAST  TECHNIQUE: Multidetector CT imaging of the abdomen and pelvis was performed following the standard protocol without IV contrast.  COMPARISON:  December 02, 2017  FINDINGS: Lower chest: No acute abnormality.  Hepatobiliary: No focal liver abnormality is seen. No gallstones, gallbladder wall thickening, or biliary dilatation.  Pancreas: Unremarkable. No pancreatic ductal dilatation or surrounding inflammatory changes.  Spleen: Normal in size without focal abnormality.  Adrenals/Urinary Tract: The bilateral  adrenal glands are normal. The right kidney is normal. There is no right hydronephrosis. There is left hydroureteronephrosis without focal discrete obstructing stone noted in the expected course of the left ureter. There are nonobstructing stones within the left kidney. The bladder is normal.  Stomach/Bowel: Stomach is within normal limits. Appendix appears normal. No evidence of bowel wall thickening, distention, or inflammatory changes.  Vascular/Lymphatic: No significant vascular findings are present. No enlarged abdominal or pelvic lymph nodes.  Reproductive: Uterus and bilateral adnexa are unremarkable.  Other: None.  Musculoskeletal: No acute or significant osseous findings.  IMPRESSION: Left hydroureteronephrosis without focal discrete obstructing stone identified in the expected course of the left ureter. Nonobstructing stones are identified within the left kidney.   Electronically Signed   By: Sherian Rein M.D.   On: 05/15/2018 15:33    CT scan imaging personally  reviewed  Assessment & Plan:    1. Left nephrolithiasis Small nonobstructing stones in the left kidney Given the size and location of the stones, recommend no intervention at this time Reviewed stone prevention techniques We will continue to monitor - Urinalysis, Complete - Cystogram; Future - CULTURE, URINE COMPREHENSIVE  2. Hydronephrosis of left kidney Chronic left hydronephrosis despite what appears to be a patent anastomosis on CT scan today We discussed that there is a risk of stenosis of the anastomosis, thus will perform a cystogram to ensure that there is reflux at this refluxing anastomosis She is agreeable this plan - CULTURE, URINE COMPREHENSIVE  3. Recurrent UTI UA appears to be clearing today on appropriate Macrobid We discussed that Macrobid is a poor choice if there is infection of the kidney involved, advised to call our office if she develops worsening or recurrent flank pain, fevers or chills or any other symptoms of systemic infection for change in antibiotics We will go ahead and extend her dose of Macrobid today to complete a slightly longer course She is agreeable this plan  Okay to return to work  Return in about 6 weeks (around 07/06/2018) for cystogram in 4 weeks, see me 6 weeks.  Vanna Scotland, MD  [facility] [address], [address] [place], [place] [address] [phone]  I spent 25 min with this patient of which greater than 50% was spent in counseling and coordination of care with the patient.

## 2018-05-26 ENCOUNTER — Ambulatory Visit: Payer: BLUE CROSS/BLUE SHIELD

## 2018-05-26 NOTE — Telephone Encounter (Signed)
Form completed and re-faxed

## 2018-05-29 LAB — CULTURE, URINE COMPREHENSIVE

## 2018-06-22 ENCOUNTER — Ambulatory Visit: Admission: RE | Admit: 2018-06-22 | Payer: BLUE CROSS/BLUE SHIELD | Source: Ambulatory Visit

## 2018-07-05 ENCOUNTER — Telehealth: Payer: Self-pay | Admitting: Urology

## 2018-07-05 NOTE — Telephone Encounter (Signed)
Patient called the office with complaint of persistent UTI symptoms.  She has completed a round of Macrobid, but does not feel that it has taken care of her infection.  Do you want her to provide another urine sample for culture or give her another abx?  She can be reached at [phone].

## 2018-07-06 ENCOUNTER — Ambulatory Visit (INDEPENDENT_AMBULATORY_CARE_PROVIDER_SITE_OTHER): Payer: BLUE CROSS/BLUE SHIELD | Admitting: Family Medicine

## 2018-07-06 ENCOUNTER — Encounter: Payer: Self-pay | Admitting: Urology

## 2018-07-06 ENCOUNTER — Emergency Department
Admission: EM | Admit: 2018-07-06 | Discharge: 2018-07-06 | Disposition: A | Discharge status: Home / Self Care | Payer: BLUE CROSS/BLUE SHIELD | Attending: Emergency Medicine | Admitting: Emergency Medicine

## 2018-07-06 ENCOUNTER — Other Ambulatory Visit: Payer: Self-pay

## 2018-07-06 ENCOUNTER — Telehealth: Payer: Self-pay | Admitting: Urology

## 2018-07-06 ENCOUNTER — Encounter: Payer: Self-pay | Admitting: Family Medicine

## 2018-07-06 VITALS — BP 107/76 | HR 81 | Ht 68.0 in | Wt 203.0 lb

## 2018-07-06 DIAGNOSIS — Z5321 Procedure and treatment not carried out due to patient leaving prior to being seen by health care provider: Secondary | ICD-10-CM | POA: Diagnosis not present

## 2018-07-06 DIAGNOSIS — N390 Urinary tract infection, site not specified: Secondary | ICD-10-CM | POA: Diagnosis not present

## 2018-07-06 DIAGNOSIS — R309 Painful micturition, unspecified: Secondary | ICD-10-CM | POA: Diagnosis not present

## 2018-07-06 DIAGNOSIS — N3289 Other specified disorders of bladder: Secondary | ICD-10-CM | POA: Insufficient documentation

## 2018-07-06 LAB — URINALYSIS, COMPLETE (UACMP) WITH MICROSCOPIC
Bilirubin Urine: NEGATIVE
Glucose, UA: NEGATIVE mg/dL
Ketones, ur: NEGATIVE mg/dL
Nitrite: NEGATIVE
Protein, ur: NEGATIVE mg/dL
Specific Gravity, Urine: 1.01 (ref 1.005–1.030)
pH: 5 (ref 5.0–8.0)

## 2018-07-06 MED ORDER — NITROFURANTOIN MONOHYD MACRO 100 MG PO CAPS: 100.0000 mg | ORAL_CAPSULE | Freq: Two times a day (BID) | ORAL | 0 refills | Status: DC

## 2018-07-06 NOTE — ED Notes (Signed)
Pt called in the WR with no response 

## 2018-07-06 NOTE — Telephone Encounter (Signed)
She was put on nurse schedule for UA.

## 2018-07-06 NOTE — Telephone Encounter (Signed)
Yes, she needs to give another urine.  It is been too long since her last UA/urine culture.  Please have her come in this afternoon on the nurse schedule.    Vanna ScotlandAshley Kenslie Abbruzzese, MD

## 2018-07-06 NOTE — Telephone Encounter (Signed)
Left message for pt to callour office

## 2018-07-06 NOTE — Progress Notes (Signed)
Patient presents today with urinary frequency and flank pain. She has nausea without vomiting and complains of a fever yesterday. She has been on ABX in the last 30 days (Macrobid). She has not had any Urological surgeries in the last 30 days. Patient is allergic to Augmentin. A urine was collected for UA, UCX.

## 2018-07-06 NOTE — Addendum Note (Signed)
Addended by: Honor LohGARRISON, Layni Kreamer M on: 07/06/2018 03:36 PM   Modules accepted: Orders

## 2018-07-06 NOTE — ED Triage Notes (Addendum)
Pt c/o bladder spasms and pain/burning with urination - pt reports hx of UTI's - pt c/o left flank pain and reports hx of kidney stones - she had a urethra reiimplant last year

## 2018-07-06 NOTE — Telephone Encounter (Signed)
Opened in error

## 2018-07-07 ENCOUNTER — Ambulatory Visit: Payer: BLUE CROSS/BLUE SHIELD | Admitting: Urology

## 2018-07-07 LAB — MICROSCOPIC EXAMINATION

## 2018-07-07 LAB — URINALYSIS, COMPLETE
Bilirubin, UA: NEGATIVE
Glucose, UA: NEGATIVE
Ketones, UA: NEGATIVE
NITRITE UA: NEGATIVE
PH UA: 5.5 (ref 5.0–7.5)
Protein, UA: NEGATIVE
Specific Gravity, UA: 1.02 (ref 1.005–1.030)
UUROB: 0.2 mg/dL (ref 0.2–1.0)

## 2018-07-10 LAB — CULTURE, URINE COMPREHENSIVE

## 2018-07-20 ENCOUNTER — Ambulatory Visit
Admission: RE | Admit: 2018-07-20 | Discharge: 2018-07-20 | Disposition: A | Discharge status: Home / Self Care | Payer: BLUE CROSS/BLUE SHIELD | Source: Ambulatory Visit | Attending: Urology | Admitting: Urology

## 2018-07-20 DIAGNOSIS — N200 Calculus of kidney: Secondary | ICD-10-CM | POA: Insufficient documentation

## 2018-07-20 DIAGNOSIS — N1330 Unspecified hydronephrosis: Secondary | ICD-10-CM | POA: Insufficient documentation

## 2018-07-20 DIAGNOSIS — N134 Hydroureter: Secondary | ICD-10-CM | POA: Insufficient documentation

## 2018-07-20 MED ORDER — IOTHALAMATE MEGLUMINE 17.2 % UR SOLN
250.0000 mL | Freq: Once | URETHRAL | Status: AC | PRN
  Administered 2018-07-20: 200 mL via INTRAVESICAL

## 2018-07-21 ENCOUNTER — Other Ambulatory Visit: Payer: Self-pay

## 2018-07-21 ENCOUNTER — Ambulatory Visit (INDEPENDENT_AMBULATORY_CARE_PROVIDER_SITE_OTHER): Payer: BLUE CROSS/BLUE SHIELD

## 2018-07-21 DIAGNOSIS — N390 Urinary tract infection, site not specified: Secondary | ICD-10-CM

## 2018-07-21 LAB — URINALYSIS, COMPLETE
Bilirubin, UA: NEGATIVE
GLUCOSE, UA: NEGATIVE
KETONES UA: NEGATIVE
NITRITE UA: POSITIVE — AB
SPEC GRAV UA: 1.02 (ref 1.005–1.030)
UUROB: 0.2 mg/dL (ref 0.2–1.0)
pH, UA: 7.5 (ref 5.0–7.5)

## 2018-07-21 LAB — MICROSCOPIC EXAMINATION: WBC, UA: >30 /hpf — ABNORMAL HIGH (ref 0–5)

## 2018-07-21 MED ORDER — FLUCONAZOLE 150 MG PO TABS: 150.0000 mg | ORAL_TABLET | Freq: Once | ORAL | 1 refills | Status: AC

## 2018-07-21 MED ORDER — NITROFURANTOIN MONOHYD MACRO 100 MG PO CAPS: 100.0000 mg | ORAL_CAPSULE | Freq: Two times a day (BID) | ORAL | 0 refills | Status: AC

## 2018-07-21 NOTE — Progress Notes (Signed)
In and Out Catheterization  Patient is present today for a I & O catheterization due to UTI. Patient was cleaned and prepped in a sterile fashion with betadine and Lidocaine 2% jelly was instilled into the urethra.  A 14FR cath was inserted no complications were noted , 50ml of urine return was noted, urine was cloudy yellow in color. A clean urine sample was collected for Culture. Bladder was drained  And catheter was removed with out difficulty.    Preformed by: Eligha BridegroomSarah Watts, CMA  Follow up/ Additional notes: patient was noted to have discharge and redness of labia . Per Dr. Lonna CobbStoioff ok to send in Diflucan and encouraged patient to take an OTC probiotic

## 2018-07-24 LAB — CULTURE, URINE COMPREHENSIVE

## 2018-07-26 ENCOUNTER — Telehealth: Payer: Self-pay

## 2018-07-26 NOTE — Progress Notes (Signed)
Per Patient having symptoms of dysuria, frequency and flank pain

## 2018-07-26 NOTE — Telephone Encounter (Signed)
I reviewed the nurse's notes from 07/21/2018.  It is unclear to me if she was coming in to make sure she is clear to previous infection or if she was currently symptomatic.  She grew a low colony count of Pseudomonas which sometimes can be a contaminant.  Can you please find out why this patient came in and we will treat accordingly as needed.  Vanna ScotlandAshley Ellen Mayol, MD

## 2018-07-26 NOTE — Telephone Encounter (Signed)
Patient was having symptoms that day dysuria, frequency and flank pain .

## 2018-07-26 NOTE — Telephone Encounter (Signed)
Please see urine culture from 07/21/18, nurse visits not routed to provider

## 2018-07-27 ENCOUNTER — Encounter: Payer: Self-pay | Admitting: Urology

## 2018-07-27 ENCOUNTER — Ambulatory Visit (INDEPENDENT_AMBULATORY_CARE_PROVIDER_SITE_OTHER): Payer: BLUE CROSS/BLUE SHIELD | Admitting: Urology

## 2018-07-27 VITALS — BP 110/78 | HR 106 | Ht 68.0 in | Wt 190.0 lb

## 2018-07-27 DIAGNOSIS — N1370 Vesicoureteral-reflux, unspecified: Secondary | ICD-10-CM

## 2018-07-27 DIAGNOSIS — N390 Urinary tract infection, site not specified: Secondary | ICD-10-CM | POA: Diagnosis not present

## 2018-07-27 DIAGNOSIS — N3090 Cystitis, unspecified without hematuria: Secondary | ICD-10-CM

## 2018-07-27 MED ORDER — TRIMETHOPRIM 100 MG PO TABS: 100.0000 mg | ORAL_TABLET | Freq: Every day | ORAL | 2 refills | Status: DC

## 2018-07-27 MED ORDER — CIPROFLOXACIN HCL 500 MG PO TABS: 500.0000 mg | ORAL_TABLET | Freq: Two times a day (BID) | ORAL | 0 refills | Status: DC

## 2018-07-27 NOTE — Progress Notes (Signed)
07/27/2018 4:15 PM   Kathleen Hutchinson 02-14-1986 [id number]  Referring provider: Trey SailorsPollak, Adriana M, PA-C [address] [place], [place] [id number]  Chief Complaint  Patient presents with  . 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  She is status post left robotic left distal ureter reimplant on 07/06/2018. Preoperative Lasix renogram showed excellent left renal function. The stricture was result of retained/impacted ureteral stone. 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   Most recent follow-up Lasix renogram on 02/08/2018 normal washout of radiotracer after diuretic administration signifying lack of significant urinary outflow obstruction.   She is continued to have recurrent dysuria, urgency frequency and left flank pain.  Due to concern for stricture of the anastomosis, she underwent a cystogram on 07/20/2018 which shows high-grade reflux up to level of the left kidney with resulting hydronephrosis and hydroureter with instillation of contrast into the bladder.  Over the past several months, she had multiple positive urine cultures primarily with enterococcus.  She is also recently grown Pseudomonas on 07/21/2018 with some mild persistent symptoms today including mild dysuria and urgency.  She is not currently on antibiotics and this culture is not been treated yet.  It was a catheterized specimen.  She currently takes no medications for UTI prevention including currently is not taking cranberry tablets, probiotics.  She is sexually active.  She does note that she often gets a infection after intercourse.  She denies anal to vaginal intercourse.  She does douche at the end of her menstrual cycle.  She does wipe front to back.  She tries to void after intercourse.  She reports that her  frequent urinary tract infections are affecting her ability to go to work.  She is requesting intermittent disability up to 2 days a week with signs or symptoms of infection noted to project her job.  PMH: Past Medical History:  Diagnosis Date  . Headaches, cluster   . History of kidney stones   . Nephrolithiasis     Surgical History: Past Surgical History:  Procedure Laterality Date  . CESAREAN SECTION    . CYSTOSCOPY W/ RETROGRADES Left 05/01/2017   Procedure: CYSTOSCOPY WITH RETROGRADE PYELOGRAM;  Surgeon: Bjorn PippinWrenn, John, MD;  Location: [facility];  Service: Urology;  Laterality: Left;  . CYSTOSCOPY W/ RETROGRADES Left 07/06/2017   Procedure: CYSTOSCOPY WITH RETROGRADE PYELOGRAM;  Surgeon: Vanna ScotlandBrandon, Vendetta Pittinger, MD;  Location: [facility];  Service: Urology;  Laterality: Left;  . CYSTOSCOPY W/ URETERAL STENT PLACEMENT Left 07/06/2017   Procedure: CYSTOSCOPY WITH STENT REPLACEMENT;  Surgeon: Vanna ScotlandBrandon, Myleigh Amara, MD;  Location: [facility];  Service: Urology;  Laterality: Left;  . CYSTOSCOPY WITH STENT PLACEMENT Left 05/01/2017   Procedure: CYSTOSCOPY WITH STENT PLACEMENT;  Surgeon: Bjorn PippinWrenn, John, MD;  Location: [facility];  Service: Urology;  Laterality: Left;  . CYSTOSCOPY/URETEROSCOPY/HOLMIUM LASER/STENT PLACEMENT Left 10/02/2016  . ROBOTICALLY ASSISTED LAPAROSCOPIC URETERAL RE-IMPLANTATION Left 07/27/2017   Procedure: ROBOTICALLY ASSISTED LAPAROSCOPIC URETERAL RE-IMPLANTATION;  Surgeon: Vanna ScotlandBrandon, Zayonna Ayuso, MD;  Location: [facility];  Service: Urology;  Laterality: Left;  . TUBAL LIGATION    . URETEROSCOPY WITH HOLMIUM LASER LITHOTRIPSY Left 07/06/2017   Procedure: URETEROSCOPY WITH HOLMIUM LASER LITHOTRIPSY;  Surgeon: Vanna ScotlandBrandon, Merleen Picazo, MD;  Location: [facility];  Service: Urology;  Laterality: Left;  Marland Kitchen. VAGINAL DELIVERY     x 4    Home Medications:  Allergies  as of 07/27/2018      Reactions   Augmentin [amoxicillin-pot Clavulanate] Rash   Has patient had a PCN reaction causing immediate rash, facial/tongue/throat  swelling, SOB or lightheadedness with hypotension: Yes Has patient had a PCN reaction causing severe rash involving mucus membranes or skin necrosis: Yes Has patient had a PCN reaction that required hospitalization: No Has patient had a PCN reaction occurring within the last 10 years: Yes If all of the above answers are "NO", then may proceed with Cephalosporin use.      Medication List        Accurate as of 07/27/18 11:59 PM. Always use your most recent med list.          acetaminophen 325 MG tablet Commonly known as:  TYLENOL Take 650 mg by mouth every 6 (six) hours as needed for mild pain.   albuterol 108 (90 Base) MCG/ACT inhaler Commonly known as:  PROVENTIL HFA;VENTOLIN HFA Inhale into the lungs every 6 (six) hours as needed for wheezing or shortness of breath.   ciprofloxacin 500 MG tablet Commonly known as:  CIPRO Take 1 tablet (500 mg total) by mouth 2 (two) times daily.   cyclobenzaprine 10 MG tablet Commonly known as:  FLEXERIL Take 10 mg by mouth 3 (three) times daily.   diazepam 2 MG tablet Commonly known as:  VALIUM Take 1 tablet (2 mg total) by mouth every 8 (eight) hours as needed for muscle spasms.   diphenhydrAMINE 25 MG tablet Commonly known as:  BENADRYL Take 25 mg by mouth daily as needed for allergies.   diphenhydramine-acetaminophen 25-500 MG Tabs tablet Commonly known as:  TYLENOL PM Take 2 tablets by mouth at bedtime as needed (sleep).   ibuprofen 800 MG tablet Commonly known as:  ADVIL,MOTRIN Take 1 tablet (800 mg total) by mouth every 8 (eight) hours as needed for moderate pain.   ketorolac 10 MG tablet Commonly known as:  TORADOL Take 1 tablet (10 mg total) by mouth every 6 (six) hours as needed for moderate pain.   meloxicam 7.5 MG tablet Commonly known as:  MOBIC Take 1 tablet (7.5 mg total) by mouth daily.   methocarbamol 500 MG tablet Commonly known as:  ROBAXIN Take 1 tablet (500 mg total) by mouth 2 (two) times daily.     multivitamin tablet Take 1 tablet by mouth daily.   nitrofurantoin (macrocrystal-monohydrate) 100 MG capsule Commonly known as:  MACROBID Take 1 capsule (100 mg total) by mouth 2 (two) times daily for 7 days.   oxyCODONE-acetaminophen 7.5-325 MG tablet Commonly known as:  PERCOCET Take 1 tablet by mouth every 6 (six) hours as needed for severe pain.   traMADol 50 MG tablet Commonly known as:  ULTRAM Take 50 mg by mouth every 6 (six) hours as needed.   trimethoprim 100 MG tablet Commonly known as:  TRIMPEX Take 1 tablet (100 mg total) by mouth daily.       Allergies:  Allergies  Allergen Reactions  . Augmentin [Amoxicillin-Pot Clavulanate] Rash    Has patient had a PCN reaction causing immediate rash, facial/tongue/throat swelling, SOB or lightheadedness with hypotension: Yes Has patient had a PCN reaction causing severe rash involving mucus membranes or skin necrosis: Yes Has patient had a PCN reaction that required hospitalization: No Has patient had a PCN reaction occurring within the last 10 years: Yes If all of the above answers are "NO", then may proceed with Cephalosporin use.     Family History: Family History  Problem Relation  Age of Onset  . Cholecystitis Mother   . Atrial fibrillation Father   . Nephrolithiasis Neg Hx   . Migraines Neg Hx     Social History:  reports that she has been smoking cigarettes. She has a 5.00 pack-year smoking history. She has never used smokeless tobacco. She reports that she drinks alcohol. She reports that she has current or past drug history. Drug: Marijuana.  ROS: 12 point review systems is negative other than as per HPI.  Physical Exam: BP 110/78   Pulse (!) 106   Ht 5\' 8"  (1.727 m)   Wt 190 lb (86.2 kg)   BMI 28.89 kg/m   Constitutional:  Alert and oriented, No acute distress. HEENT: [place] AT, moist mucus membranes.  Trachea midline, no masses. Cardiovascular: No clubbing, cyanosis, or edema. Respiratory: Normal  respiratory effort, no increased work of breathing. Skin: No rashes, bruises or suspicious lesions. Neurologic: Grossly intact, no focal deficits, moving all 4 extremities. Psychiatric: Normal mood and affect.  Laboratory Data: Lab Results  Component Value Date   WBC 9.0 05/14/2018   HGB 14.4 05/14/2018   HCT 41.0 05/14/2018   MCV 88.5 05/14/2018   PLT 261 05/14/2018    Lab Results  Component Value Date   CREATININE 0.79 05/14/2018    Urinalysis UA/urine culture from 07/21/2018 reviewed, growing 10-25,000 colonies of Pseudomonas, pansensitive.  Pertinent Imaging: CLINICAL DATA:  Status post ureteral implant. History of reflux and recurrent UTIs.  EXAM: CYSTOGRAM  TECHNIQUE: After catheterization of the urinary bladder following sterile technique the bladder was filled with Cysto-Hypaque 30% by drip infusion. Serial spot images were obtained during bladder filling and post draining.  FLUOROSCOPY TIME:  Fluoroscopy Time:  1 minutes 48 seconds  Radiation Exposure Index (if provided by the fluoroscopic device): 55.0 mGy  Number of Acquired Spot Images: 8  COMPARISON:  CT 05/15/2018  FINDINGS: Medially following introduction of contrast into the bladder the left ureter was noted to fill with contrast. Prominent reflux of contrast into the left ureter and left renal collecting system noted with resultant left hydronephrosis and hydroureter. Slight deformity noted the bladder consistent prior history of surgery. No focal bladder abnormalities otherwise noted. Moderate postvoid residual.  IMPRESSION: Left vesicoureteral prominent reflux with resulting hydronephrosis and hydroureter.   Electronically Signed   By: Maisie Fus  Register   On: 07/20/2018 12:23  Cystogram imaging personally reviewed today with the patient.   Assessment & Plan:    1. Recurrent UTI Recent bout of recurrent urinary tract infections We discussed hygiene issues today including  sexual behavior as well as avoidance of douching I would like her to start cranberry tabs twice daily as well as a probiotic We will go ahead and treat her current infection with Pseudomonas followed by initiation of suppressive trimethoprim for 3 months to see if we can break the cycle At risk for pyelonephritis given presence of reflux as below  2. Unilateral vesicoureteral reflux Anticipated left hydroureteronephrosis secondary to refluxing anastomosis at the time of ureteral reimplant Will follow with serial annual renal ultrasound High risk for pyelonephritis with a sending infections  3. Cystitis Recent urine culture growing low colony count of Pseudomonas, possibly from catheter placement at the time of cystogram Mildly symptomatic today We will treat with Cipro, advised to avoid vigorous activity and warned about risk of tendon rupture   Return in about 3 months (around 10/27/2018) for recheck with UA.  Vanna Scotland, MD  [facility] [address], [phone]  [place], [place] [address] [phone]

## 2018-08-02 ENCOUNTER — Telehealth: Payer: Self-pay | Admitting: Urology

## 2018-08-02 NOTE — Telephone Encounter (Signed)
Patient called the office and left a voice mail message inquiring about some paperwork that she needs.  She can be reached at [phone].

## 2018-08-05 NOTE — Telephone Encounter (Signed)
[facility].

## 2018-08-10 ENCOUNTER — Ambulatory Visit: Payer: BLUE CROSS/BLUE SHIELD | Admitting: Urology

## 2018-08-10 ENCOUNTER — Other Ambulatory Visit: Payer: Self-pay | Admitting: Physician Assistant

## 2018-08-10 DIAGNOSIS — M25512 Pain in left shoulder: Secondary | ICD-10-CM

## 2018-08-10 DIAGNOSIS — M25562 Pain in left knee: Secondary | ICD-10-CM

## 2018-08-10 DIAGNOSIS — M25552 Pain in left hip: Secondary | ICD-10-CM

## 2018-09-17 ENCOUNTER — Telehealth: Payer: Self-pay | Admitting: Family Medicine

## 2018-09-17 NOTE — Telephone Encounter (Signed)
Maybe 1 month of suppression was sufficient.  Just have her stop the medication we will see how it goes.    Vanna Scotland, MD

## 2018-09-17 NOTE — Telephone Encounter (Signed)
Patient called and is feeling bad on the Trimethoprim, she has been on it for about 1 month and states she is nauseous, dizzy, having a hard time looking at her computer at work. She mentioned doing short term disability because she can't work on the medicine.  I informed to stop the medication and I will see if there is something else she can try.

## 2018-09-17 NOTE — Telephone Encounter (Signed)
Patient notified

## 2018-10-06 ENCOUNTER — Encounter: Payer: BLUE CROSS/BLUE SHIELD | Admitting: Physician Assistant

## 2018-10-26 ENCOUNTER — Encounter: Payer: Self-pay | Admitting: Urology

## 2018-10-26 ENCOUNTER — Ambulatory Visit: Payer: BLUE CROSS/BLUE SHIELD | Admitting: Urology

## 2018-12-31 ENCOUNTER — Ambulatory Visit: Payer: Self-pay | Admitting: Physician Assistant

## 2019-01-03 ENCOUNTER — Ambulatory Visit: Payer: Self-pay | Admitting: Physician Assistant

## 2019-01-04 ENCOUNTER — Ambulatory Visit (INDEPENDENT_AMBULATORY_CARE_PROVIDER_SITE_OTHER): Payer: Medicaid Other | Admitting: Physician Assistant

## 2019-01-04 ENCOUNTER — Encounter: Payer: Self-pay | Admitting: Physician Assistant

## 2019-01-04 DIAGNOSIS — M533 Sacrococcygeal disorders, not elsewhere classified: Secondary | ICD-10-CM | POA: Diagnosis not present

## 2019-01-04 DIAGNOSIS — M545 Low back pain, unspecified: Secondary | ICD-10-CM

## 2019-01-04 DIAGNOSIS — S060X9S Concussion with loss of consciousness of unspecified duration, sequela: Secondary | ICD-10-CM

## 2019-01-04 DIAGNOSIS — M542 Cervicalgia: Secondary | ICD-10-CM

## 2019-01-04 DIAGNOSIS — N200 Calculus of kidney: Secondary | ICD-10-CM | POA: Diagnosis not present

## 2019-01-04 NOTE — Patient Instructions (Signed)
Concussion, Adult  A concussion is a brain injury from a direct hit (blow) to the head or body. This injury causes the brain to shake quickly back and forth inside the skull. It is caused by:  · A hit to the head.  · A quick and sudden movement (jolt) of the head or neck.  How fast you will get better from a concussion depends on many things. Recovery can take time. It is important to wait to return to activity until a doctor says it is safe and your symptoms are all gone.  Follow these instructions at home:  Activity  · Limit activities that need a lot of thought or concentration. You may need to talk with your work manager or teachers about this. Limit activities such as:  ? Homework or work for your job.  ? Watching TV.  ? Computer work.  ? Playing memory games and puzzles.  · Rest. Rest helps the brain to heal. Make sure you:  ? Get plenty of sleep at night. Do not stay up late.  ? Rest during the day. Take naps or rest breaks when you feel tired.  · Do not do activities that could cause a second concussion, such as riding a bike or playing sports. It can be dangerous if you get another concussion before the first one has healed.  · Ask your doctor when you can return to your normal activities, like driving, riding a bike, or using machinery. Your ability to react may be slower. Do not do these activities if you are dizzy. Your doctor will likely give you a plan for slowly going back to activities.  General instructions  · Take over-the-counter and prescription medicines only as told by your doctor.  · Do not drink alcohol until your doctor says you can.  · Watch your symptoms and tell other people to do the same. Other problems (complications) can happen after a concussion. Older adults with a brain injury may have a higher risk of serious problems, such as a blood clot in the brain.  · Tell your work manager, teachers, school nurse, school counselor, coach, or athletic trainer about your injury and symptoms.  Tell them about what you can or cannot do. They should watch you for:  ? More problems with attention or concentration.  ? More trouble remembering or learning new information.  ? More time needed to do tasks or assignments.  ? Being more annoyed (irritable) or having a harder time dealing with stress.  ? Any other symptoms that get worse.  · Keep all follow-up visits as told by your doctor. This is important.  Prevention  · It is very important that you donot get another brain injury, especially before you have healed. In rare cases, another injury can cause permanent brain damage, brain swelling, or death. You have the most risk if you get another head injury in the first 7-10 days after you were hurt before. To avoid injuries:  ? Avoid activities that could make you get a second concussion, like contact sports.  ? When you have returned to sports or activities:  § Avoid plays or moves that can cause you to crash into another person. This is how most concussions happen.  § Follow the rules and be respectful of other players.  ? Get regular exercise that includes strength and balance training.  ? Wear a helmet when you do activities like:  § Biking.  § Skiing.  § Skateboarding.  § Skating.  ?   Helmets can help protect you from serious skull and brain injuries, but they do not protect your from a concussion. Even when wearing a helmet, you should avoid being hit in the head.  Contact a doctor if:  · Your symptoms get worse or they do not get better.  · You have new symptoms.  · You have another injury.  Get help right away if:  · You have bad headaches or your headaches get worse.  · You have weakness in any part of your body.  · You are confused.  · Your coordination gets worse.  · You keep throwing up (vomiting).  · You feel more sleepy than normal.  · You twitch or shake violently (convulse) or have a seizure.  · Your speech is not clear (is slurred).  · You have strange behavior changes.  · You have changes in  how you see (vision).  · You pass out (lose consciousness).  Summary  · A concussion is a brain injury from a direct hit (blow) to the head or body.  · This condition is treated with rest and careful watching of symptoms.  · If you keep having symptoms, call your doctor.  This information is not intended to replace advice given to you by your health care provider. Make sure you discuss any questions you have with your health care provider.  Document Released: 11/12/2009 Document Revised: 01/05/2018 Document Reviewed: 01/05/2018  [employer]vier Interactive Patient Education © 2019 [employer]
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
## 2019-01-11 DIAGNOSIS — Z79899 Other long term (current) drug therapy: Secondary | ICD-10-CM | POA: Diagnosis not present

## 2019-02-01 ENCOUNTER — Ambulatory Visit: Payer: Medicaid Other | Admitting: Physician Assistant

## 2019-02-02 ENCOUNTER — Ambulatory Visit: Payer: Medicaid Other

## 2019-02-08 ENCOUNTER — Ambulatory Visit: Payer: Medicaid Other

## 2019-02-09 ENCOUNTER — Ambulatory Visit: Payer: Medicaid Other

## 2019-02-14 ENCOUNTER — Ambulatory Visit: Payer: Medicaid Other | Attending: Physician Assistant

## 2019-02-14 ENCOUNTER — Other Ambulatory Visit: Payer: Self-pay

## 2019-02-14 DIAGNOSIS — M5412 Radiculopathy, cervical region: Secondary | ICD-10-CM | POA: Insufficient documentation

## 2019-02-14 DIAGNOSIS — M545 Low back pain, unspecified: Secondary | ICD-10-CM

## 2019-02-14 DIAGNOSIS — M542 Cervicalgia: Secondary | ICD-10-CM

## 2019-02-14 DIAGNOSIS — M5417 Radiculopathy, lumbosacral region: Secondary | ICD-10-CM | POA: Insufficient documentation

## 2019-02-14 DIAGNOSIS — R262 Difficulty in walking, not elsewhere classified: Secondary | ICD-10-CM | POA: Diagnosis not present

## 2019-02-14 DIAGNOSIS — M6281 Muscle weakness (generalized): Secondary | ICD-10-CM | POA: Diagnosis not present
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
## 2019-02-14 NOTE — Patient Instructions (Signed)
Pt was recommended to use ice pack to neck to help decrease inflammation if present. Heating pad if the ice does not work, cloth barrier. 15 minutes at a time. Pt verbalized understanding.

## 2019-02-15 ENCOUNTER — Ambulatory Visit: Payer: Self-pay | Admitting: Urology

## 2019-02-15 DIAGNOSIS — N390 Urinary tract infection, site not specified: Secondary | ICD-10-CM | POA: Diagnosis not present

## 2019-02-15 DIAGNOSIS — R350 Frequency of micturition: Secondary | ICD-10-CM | POA: Diagnosis not present

## 2019-02-22 ENCOUNTER — Ambulatory Visit: Payer: Medicaid Other

## 2019-02-22 ENCOUNTER — Ambulatory Visit: Payer: Medicaid Other | Admitting: Urology

## 2019-02-22 ENCOUNTER — Other Ambulatory Visit: Payer: Self-pay

## 2019-02-22 DIAGNOSIS — M545 Low back pain: Secondary | ICD-10-CM | POA: Diagnosis not present

## 2019-02-22 DIAGNOSIS — M5417 Radiculopathy, lumbosacral region: Secondary | ICD-10-CM | POA: Diagnosis not present

## 2019-02-22 DIAGNOSIS — M5412 Radiculopathy, cervical region: Secondary | ICD-10-CM

## 2019-02-22 DIAGNOSIS — M6281 Muscle weakness (generalized): Secondary | ICD-10-CM | POA: Diagnosis not present

## 2019-02-22 DIAGNOSIS — M542 Cervicalgia: Secondary | ICD-10-CM | POA: Diagnosis not present

## 2019-02-22 DIAGNOSIS — R262 Difficulty in walking, not elsewhere classified: Secondary | ICD-10-CM | POA: Diagnosis not present

## 2019-02-22 NOTE — Patient Instructions (Signed)
   Press your tongue at the roof of your mouth.    Hold for 5 seconds     Repeat 10 times,   Perform at least 3 sets per day.
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
## 2019-02-22 NOTE — Progress Notes (Incomplete)
02/22/2019  8:26 AM   Kathleen Bockheresa Gayle Najera 27-Oct-1986 [id number]  Referring provider: Trey SailorsPollak, Adriana M, PA-C [address] [place], [place] [id number]  No chief complaint on file.   HPI: Kathleen Hutchinson is a 33 yo F who returns today for the evaluation and management of flank pain and light bladder spasms.   She is status post left robotic left distal ureter reimplant on 07/06/2018. Preoperative Lasix renogram showed excellent left renal function. The stricture was result of retained/impacted ureteral stone. 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   Most recent follow-up Lasix renogram on 02/08/2018 normal washout of radiotracer after diuretic administration signifying lack of significant urinary outflow obstruction.   She is continued to have recurrent dysuria, urgency frequency and left flank pain.  Due to concern for stricture of the anastomosis, she underwent a cystogram on 07/20/2018 which shows high-grade reflux up to level of the left kidney with resulting hydronephrosis and hydroureter with instillation of contrast into the bladder.   PMH: Past Medical History:  Diagnosis Date   Headaches, cluster    History of kidney stones    Nephrolithiasis     Surgical History: Past Surgical History:  Procedure Laterality Date   CESAREAN SECTION     CYSTOSCOPY W/ RETROGRADES Left 05/01/2017   Procedure: CYSTOSCOPY WITH RETROGRADE PYELOGRAM;  Surgeon: Bjorn PippinWrenn, John, MD;  Location: [facility];  Service: Urology;  Laterality: Left;   CYSTOSCOPY W/ RETROGRADES Left 07/06/2017   Procedure: CYSTOSCOPY WITH RETROGRADE PYELOGRAM;  Surgeon: Vanna ScotlandBrandon, Ashley, MD;  Location: [facility];  Service: Urology;  Laterality: Left;   CYSTOSCOPY W/ URETERAL STENT PLACEMENT Left 07/06/2017   Procedure: CYSTOSCOPY WITH STENT REPLACEMENT;  Surgeon: Vanna ScotlandBrandon, Ashley, MD;  Location: [facility];  Service: Urology;  Laterality: Left;   CYSTOSCOPY WITH  STENT PLACEMENT Left 05/01/2017   Procedure: CYSTOSCOPY WITH STENT PLACEMENT;  Surgeon: Bjorn PippinWrenn, John, MD;  Location: [facility];  Service: Urology;  Laterality: Left;   CYSTOSCOPY/URETEROSCOPY/HOLMIUM LASER/STENT PLACEMENT Left 10/02/2016   ROBOTICALLY ASSISTED LAPAROSCOPIC URETERAL RE-IMPLANTATION Left 07/27/2017   Procedure: ROBOTICALLY ASSISTED LAPAROSCOPIC URETERAL RE-IMPLANTATION;  Surgeon: Vanna ScotlandBrandon, Ashley, MD;  Location: [facility];  Service: Urology;  Laterality: Left;   TUBAL LIGATION     URETEROSCOPY WITH HOLMIUM LASER LITHOTRIPSY Left 07/06/2017   Procedure: URETEROSCOPY WITH HOLMIUM LASER LITHOTRIPSY;  Surgeon: Vanna ScotlandBrandon, Ashley, MD;  Location: [facility];  Service: Urology;  Laterality: Left;   VAGINAL DELIVERY     x 4    Home Medications:  Allergies as of 02/22/2019      Reactions   Augmentin [amoxicillin-pot Clavulanate] Rash   Has patient had a PCN reaction causing immediate rash, facial/tongue/throat swelling, SOB or lightheadedness with hypotension: Yes Has patient had a PCN reaction causing severe rash involving mucus membranes or skin necrosis: Yes Has patient had a PCN reaction that required hospitalization: No Has patient had a PCN reaction occurring within the last 10 years: Yes If all of the above answers are "NO", then may proceed with Cephalosporin use.      Medication List       Accurate as of February 22, 2019  8:26 AM. Always use your most recent med list.        acetaminophen 325 MG tablet Commonly known as:  TYLENOL Take 650 mg by mouth every 6 (six) hours as needed for mild pain.   Adderall XR 30 MG 24 hr capsule Generic drug:  amphetamine-dextroamphetamine Take 30 mg by mouth daily.  albuterol 108 (90 Base) MCG/ACT inhaler Commonly known as:  PROVENTIL HFA;VENTOLIN HFA Inhale into the lungs every 6 (six) hours as needed for wheezing or shortness of breath.   ALPRAZolam 0.25 MG tablet Commonly known as:  XANAX Take 0.25 mg by mouth 3 (three) times  daily as needed for anxiety.   cyclobenzaprine 10 MG tablet Commonly known as:  FLEXERIL Take 10 mg by mouth 3 (three) times daily.   diphenhydrAMINE 25 MG tablet Commonly known as:  BENADRYL Take 25 mg by mouth daily as needed for allergies.   diphenhydramine-acetaminophen 25-500 MG Tabs tablet Commonly known as:  TYLENOL PM Take 2 tablets by mouth at bedtime as needed (sleep).   ibuprofen 800 MG tablet Commonly known as:  ADVIL,MOTRIN Take 1 tablet (800 mg total) by mouth every 8 (eight) hours as needed for moderate pain.   methocarbamol 500 MG tablet Commonly known as:  Robaxin Take 1 tablet (500 mg total) by mouth 2 (two) times daily.   multivitamin tablet Take 1 tablet by mouth daily.   ONE-A-DAY WOMENS PO Take by mouth.   sertraline 50 MG tablet Commonly known as:  ZOLOFT Take 50 mg by mouth daily.   traMADol 50 MG tablet Commonly known as:  ULTRAM Take 50 mg by mouth every 6 (six) hours as needed.       Allergies:  Allergies  Allergen Reactions   Augmentin [Amoxicillin-Pot Clavulanate] Rash    Has patient had a PCN reaction causing immediate rash, facial/tongue/throat swelling, SOB or lightheadedness with hypotension: Yes Has patient had a PCN reaction causing severe rash involving mucus membranes or skin necrosis: Yes Has patient had a PCN reaction that required hospitalization: No Has patient had a PCN reaction occurring within the last 10 years: Yes If all of the above answers are "NO", then may proceed with Cephalosporin use.     Family History: Family History  Problem Relation Age of Onset   Cholecystitis Mother    Atrial fibrillation Father    Nephrolithiasis Neg Hx    Migraines Neg Hx     Social History:  reports that she has been smoking cigarettes. She has a 5.00 pack-year smoking history. She has never used smokeless tobacco. She reports current alcohol use. She reports current drug use. Drug: Marijuana.  ROS:                                          Physical Exam: There were no vitals taken for this visit.  Constitutional:  Alert and oriented, No acute distress. HEENT: [place] AT, moist mucus membranes.  Trachea midline, no masses. Cardiovascular: No clubbing, cyanosis, or edema. Respiratory: Normal respiratory effort, no increased work of breathing. GI: Abdomen is soft, nontender, nondistended, no abdominal masses GU: No CVA tenderness Lymph: No cervical or inguinal lymphadenopathy. Skin: No rashes, bruises or suspicious lesions. Neurologic: Grossly intact, no focal deficits, moving all 4 extremities. Psychiatric: Normal mood and affect.  Laboratory Data: Lab Results  Component Value Date   WBC 9.0 05/14/2018   HGB 14.4 05/14/2018   HCT 41.0 05/14/2018   MCV 88.5 05/14/2018   PLT 261 05/14/2018    Lab Results  Component Value Date   CREATININE 0.79 05/14/2018    No results found for: PSA  No results found for: TESTOSTERONE  No results found for: HGBA1C  Urinalysis    Component Value Date/Time   COLORURINE  YELLOW (A) 07/06/2018 1402   APPEARANCEUR Cloudy (A) 07/21/2018 1325   LABSPEC 1.010 07/06/2018 1402   PHURINE 5.0 07/06/2018 1402   GLUCOSEU Negative 07/21/2018 1325   HGBUR SMALL (A) 07/06/2018 1402   BILIRUBINUR Negative 07/21/2018 1325   KETONESUR NEGATIVE 07/06/2018 1402   PROTEINUR 3+ (A) 07/21/2018 1325   PROTEINUR NEGATIVE 07/06/2018 1402   NITRITE Positive (A) 07/21/2018 1325   NITRITE NEGATIVE 07/06/2018 1402   LEUKOCYTESUR 3+ (A) 07/21/2018 1325    Lab Results  Component Value Date   LABMICR See below: 07/21/2018   WBCUA >30 (H) 07/21/2018   RBCUA 11-30 (A) 07/21/2018   LABEPIT >10 (H) 07/21/2018   MUCUS Present (A) 07/21/2018   BACTERIA Many (A) 07/21/2018    Pertinent Imaging: *** Results for orders placed during the hospital encounter of 07/27/17  DG Abd 1 View   Narrative CLINICAL DATA:  Left ureteral stricture.  EXAM: ABDOMEN - 1  VIEW  COMPARISON:  CT abdomen and pelvis dated May 01, 2017.  FINDINGS: Left nephroureteral stent in place. Surgical drain in the pelvis. The bowel gas pattern is normal. No radio-opaque calculi or other significant radiographic abnormality are seen.  IMPRESSION: Left nephroureteral stent in place.  No acute abnormality.   Electronically Signed   By: Obie Dredge M.D.   On: 07/27/2017 15:52    No results found for this or any previous visit. No results found for this or any previous visit. No results found for this or any previous visit. No results found for this or any previous visit. No results found for this or any previous visit. No results found for this or any previous visit. Results for orders placed during the hospital encounter of 05/15/18  CT Renal Stone Study   Narrative CLINICAL DATA:  Left flank pain for 1 week  EXAM: CT ABDOMEN AND PELVIS WITHOUT CONTRAST  TECHNIQUE: Multidetector CT imaging of the abdomen and pelvis was performed following the standard protocol without IV contrast.  COMPARISON:  December 02, 2017  FINDINGS: Lower chest: No acute abnormality.  Hepatobiliary: No focal liver abnormality is seen. No gallstones, gallbladder wall thickening, or biliary dilatation.  Pancreas: Unremarkable. No pancreatic ductal dilatation or surrounding inflammatory changes.  Spleen: Normal in size without focal abnormality.  Adrenals/Urinary Tract: The bilateral adrenal glands are normal. The right kidney is normal. There is no right hydronephrosis. There is left hydroureteronephrosis without focal discrete obstructing stone noted in the expected course of the left ureter. There are nonobstructing stones within the left kidney. The bladder is normal.  Stomach/Bowel: Stomach is within normal limits. Appendix appears normal. No evidence of bowel wall thickening, distention, or inflammatory changes.  Vascular/Lymphatic: No significant vascular  findings are present. No enlarged abdominal or pelvic lymph nodes.  Reproductive: Uterus and bilateral adnexa are unremarkable.  Other: None.  Musculoskeletal: No acute or significant osseous findings.  IMPRESSION: Left hydroureteronephrosis without focal discrete obstructing stone identified in the expected course of the left ureter. Nonobstructing stones are identified within the left kidney.   Electronically Signed   By: Sherian Rein M.D.   On: 05/15/2018 15:33     Assessment & Plan:    @DIAGMED @  No follow-ups on file.  [facility] [address], [address] [place], [place] [address] [phone]  I, Donne Hazel, am acting as a scribe for Dr. Vanna Scotland,  {Add Scribe Attestation Statement}

## 2019-02-28 ENCOUNTER — Telehealth: Payer: Self-pay | Admitting: Urology

## 2019-02-28 NOTE — Therapy (Signed)
[facility] [address] [place], [address] Phone: [phone]   Fax:  [phone]  Patient Details  Name: Kathleen Hutchinson MRN: [id number] Date of Birth: October 17, 1986 Referring Provider:  No ref. provider found  Encounter Date: 02/28/2019     Called patient and informed patient about current clinic closure for a minimum of 2 weeks due to the corona virus outbreak. Added L scapular retraction 10x3 with 5 second holds as part of her HEP. Pt verbalized understanding. Pt also said that she felt better after last session, neck was not as tight. Has been trying to move. Still hurting. Interested in Telehealth or online PT program if available while the clinic is closed. Pt was informed that when the clinic opens up again, someone should be giving her a call to schedule more appointments if needed. Can call clinic back if there are any questions.      Loralyn Freshwater PT, DPT  02/28/2019, 1:38 PM  [place] [facility] MEDIC[facility] [address], [place], [address] Phone: [phone]   Fax:  [phone]

## 2019-02-28 NOTE — Telephone Encounter (Signed)
Patient has two upcoming apps with you in April one on the 8th for 1 year follow up with KUB and RUS prior she no showed for her RUS and now I'm trying to get it reapproved with her insurance. Does she still need it and does she still need follow up?   Marcelino Duster

## 2019-02-28 NOTE — Telephone Encounter (Signed)
Yes but in light of the global pandemic, I think it is fine for these to be pushed out 1-2 months.

## 2019-02-28 NOTE — Patient Instructions (Signed)
Added L scapular retraction 10x3 with 5 second holds as part of her HEP. Pt verbalized understanding.

## 2019-03-01 ENCOUNTER — Ambulatory Visit: Payer: Medicaid Other

## 2019-03-07 ENCOUNTER — Ambulatory Visit: Payer: Medicaid Other

## 2019-03-16 ENCOUNTER — Ambulatory Visit: Payer: Self-pay | Admitting: Urology

## 2019-03-28 NOTE — Therapy (Signed)
[facility] PH[facility] [address], [place], [address] Phone: [phone]   Fax:  [phone]  Patient Details  Name: Kathleen Hutchinson MRN: [id number] Date of Birth: [birth year] Referring Provider:  No ref. provider found  Encounter Date: 03/28/2019   Talked to patient today in regards to telehealth services. The patient expressed an interest in participating in telehealth visits. Patient has been informed that an [facility] support representative will be reaching out to them to verify their insurance benefits and for scheduling   Pt also states losing function in her R hand due to numbness. Pt was recommended to perform B scapular retraction 10x5 seconds for 3x/day as well as to continue with pressing her tongue at the roof of her mouth to activate anterior cervical muscles. Pt verbalized understanding. Had pt perform L cervical side bending 10x5 seconds. Pulling sensation felt R lower neck/upper shoulder area. Eases gradually with rest. Then R cervical side bend 10x5 seconds. Pulling sensation L shoulder blade area. Then L cervical side bend again 10x5 seconds. Had to stop phone conversation with pt secondary to next patient arrived to clinic. Ms Faughnan was informed that treatment for her condition might be better performed once the telehealth is set up for better visualization of pt performing exercises and exercise correction if needed as well as visual description of where symptoms are. Pt expressed wanting to continue with telehealth.     Loralyn Freshwater PT, DPT   03/28/2019, 2:35 PM  [facility] PHYS[facility] AND SPORTS MEDICINE [address], [place], [address] Phone: [phone]   Fax:  [phone]

## 2019-03-29 DIAGNOSIS — M545 Low back pain, unspecified: Secondary | ICD-10-CM

## 2019-03-29 DIAGNOSIS — M542 Cervicalgia: Secondary | ICD-10-CM

## 2019-03-29 DIAGNOSIS — R262 Difficulty in walking, not elsewhere classified: Secondary | ICD-10-CM

## 2019-03-29 DIAGNOSIS — M6281 Muscle weakness (generalized): Secondary | ICD-10-CM

## 2019-03-29 DIAGNOSIS — M5412 Radiculopathy, cervical region: Secondary | ICD-10-CM

## 2019-03-29 DIAGNOSIS — M5417 Radiculopathy, lumbosacral region: Secondary | ICD-10-CM

## 2019-03-29 NOTE — Therapy (Addendum)
[facility] [address], [place], [address] Phone: [phone]   Fax:  [phone]  Physical Therapy Note  Patient Details  Name: Kathleen Hutchinson MRN: [id number] Date of Birth: January 01, 1986 Referring Provider (PT): Osvaldo Angst, [place]   Encounter Date: 03/29/2019  PT End of Session - 03/29/19 1648    Visit Number  2    Number of Visits  4    Date for PT Re-Evaluation  03/17/19    Authorization Type  0    Authorization Time Period  of 8 Medicaid       Past Medical History:  Diagnosis Date  . Headaches, cluster   . History of kidney stones   . Nephrolithiasis     Past Surgical History:  Procedure Laterality Date  . CESAREAN SECTION    . CYSTOSCOPY W/ RETROGRADES Left 05/01/2017   Procedure: CYSTOSCOPY WITH RETROGRADE PYELOGRAM;  Surgeon: Bjorn Pippin, MD;  Location: [facility];  Service: Urology;  Laterality: Left;  . CYSTOSCOPY W/ RETROGRADES Left 07/06/2017   Procedure: CYSTOSCOPY WITH RETROGRADE PYELOGRAM;  Surgeon: Vanna Scotland, MD;  Location: [facility];  Service: Urology;  Laterality: Left;  . CYSTOSCOPY W/ URETERAL STENT PLACEMENT Left 07/06/2017   Procedure: CYSTOSCOPY WITH STENT REPLACEMENT;  Surgeon: Vanna Scotland, MD;  Location: [facility];  Service: Urology;  Laterality: Left;  . CYSTOSCOPY WITH STENT PLACEMENT Left 05/01/2017   Procedure: CYSTOSCOPY WITH STENT PLACEMENT;  Surgeon: Bjorn Pippin, MD;  Location: [facility];  Service: Urology;  Laterality: Left;  . CYSTOSCOPY/URETEROSCOPY/HOLMIUM LASER/STENT PLACEMENT Left 10/02/2016  . ROBOTICALLY ASSISTED LAPAROSCOPIC URETERAL RE-IMPLANTATION Left 07/27/2017   Procedure: ROBOTICALLY ASSISTED LAPAROSCOPIC URETERAL RE-IMPLANTATION;  Surgeon: Vanna Scotland, MD;  Location: [facility];  Service: Urology;  Laterality: Left;  . TUBAL LIGATION    . URETEROSCOPY WITH HOLMIUM LASER LITHOTRIPSY Left 07/06/2017   Procedure: URETEROSCOPY WITH HOLMIUM LASER  LITHOTRIPSY;  Surgeon: Vanna Scotland, MD;  Location: [facility];  Service: Urology;  Laterality: Left;  Marland Kitchen VAGINAL DELIVERY     x 4    There were no vitals filed for this visit.                            The outpatient physical therapy location has been closed except for patients who have undergone recent surgical procedures due to COVID-19 related limitations. Called patient on 03/28/2019 to check up on her to see how she is doing.  Pt states that she continues to be in pain and is currently losing function in her R hand due to numbness. Pt was only able to come to one physical therapy session in the clinic prior to shut down and had positive results with manual therapy focused intervention secondary to irritability of symptoms. Physical therapist received permission from clinic supervisor to have patient return to the clinic for a couple of weeks to help get her back in track and keep her from regressing, then to follow up with the patient through telehealth afterwards. Pt will benefit from continued skilled physical therapy services to reduce, pain, improve strength and function.      PT Short Term Goals - 03/29/19 1646      PT SHORT TERM GOAL #1   Title  Pt will be independent with her HEP to decrease pain, improve strength and function.     Baseline  Pt has not yet started her HEP (02/14/2019); Pt has started a limited HEP (02/22/2019)  Time  3    Period  Weeks    Status  On-going    Target Date  03/10/19        PT Long Term Goals - 03/29/19 1647      PT LONG TERM GOAL #1   Title  Patient will have a decrease in neck pain to 5/10 or less at worst to promote ability to look around, use her R UE more comfortably.     Baseline  10/10 neck pain at most for the past month (02/14/2019)    Time  4    Period  Weeks    Status  Unable to assess    Target Date  05/05/19      PT LONG TERM GOAL #2   Title  Patient will have a decrease in back pain to 5/10 or less at  worst to promote ability to perform sit <> stand, car transfers as well as improve ability to sleep at night more comfortably.     Baseline  10/10 back pain at worst for the past month (02/14/2019)    Time  4    Period  Weeks    Status  Unable to assess    Target Date  05/05/19      PT LONG TERM GOAL #3   Title  Pt will improve R hand grip strength by at least 20 lbs to promote ability to hold items in her hand and use her R hand for functional tasks.     Baseline  6.67 lbs average R grip strength (02/14/2019)    Time  4    Period  Weeks    Status  Unable to assess    Target Date  05/05/19            Plan - 03/29/19 1659    Clinical Impression Statement  The outpatient physical therapy location has been closed except for patients who have undergone recent surgical procedures due to COVID-19 related limitations. Called patient on 03/28/2019 to check up on her to see how she is doing.  Pt states that she continues to be in pain and is currently losing function in her R hand due to numbness. Pt was only able to come to one physical therapy session in the clinic prior to shut down and had positive results with manual therapy focused intervention secondary to irritability of symptoms. Physical therapist received permission from clinic supervisor to have patient return to the clinic for a couple of weeks to help get her back in track and keep her from regressing, then to follow up with the patient through telehealth afterwards. Pt will benefit from continued skilled physical therapy services to reduce, pain, improve strength and function.      Personal Factors and Comorbidities  Age;Past/Current Experience;Time since onset of injury/illness/exacerbation    Examination-Activity Limitations  Bed Mobility;Bend;Caring for Others;Carry;Dressing;Lift;Reach Overhead;Sit;Sleep;Squat;Stand;Transfers    Examination-Participation Restrictions  Cleaning;Laundry   difficulty with   Stability/Clinical Decision  Making  Evolving/Moderate complexity    Clinical Decision Making  Moderate   losing function in R hand   Rehab Potential  Fair    PT Frequency  2x / week    PT Duration  4 weeks   due to insurance   PT Treatment/Interventions  Gait training;Stair training;Functional mobility training;Therapeutic activities;Therapeutic exercise;Neuromuscular re-education;Patient/family education;Manual techniques;Passive range of motion;Dry needling;Spinal Manipulations;Joint Manipulations;Traction;Aquatic Therapy;Electrical Stimulation;Iontophoresis 4mg /ml Dexamethasone;Ultrasound   traction, manipulations if appropriate   PT Next Visit Plan  pain control, posture, anterior cervical and trunk  muscle activation, scapular and hip strengthening, manual techniques, modalities PRN     Consulted and Agree with Plan of Care  Patient       Patient will benefit from skilled therapeutic intervention in order to improve the following deficits and impairments:  Pain, Postural dysfunction, Improper body mechanics, Impaired sensation, Impaired UE functional use, Difficulty walking, Decreased strength, Decreased range of motion, Abnormal gait, Decreased activity tolerance  Visit Diagnosis: Cervicalgia  Radiculopathy, cervical region  Muscle weakness (generalized)  Acute bilateral low back pain, unspecified whether sciatica present  Difficulty in walking, not elsewhere classified  Radiculopathy, lumbosacral region     Problem List Patient Active Problem List   Diagnosis Date Noted  . Flank pain   . Sepsis (HCC) 12/02/2017  . Acute pyelonephritis 12/02/2017  . Ureteral stricture, left 07/27/2017  . UTI (urinary tract infection) 05/01/2017  . Hydronephrosis concurrent with and due to ureteral stricture 05/01/2017  . Left nephrolithiasis 05/01/2017    Loralyn Freshwater PT, DPT   03/30/2019, 2:18 PM  [facility] PHYS[facility] AND SPORTS MEDICINE [address] [place],  [address] Phone: [phone]   Fax:  [phone]  Name: Kathleen Hutchinson MRN: [id number] Date of Birth: [birth year]

## 2019-03-30 NOTE — Addendum Note (Signed)
Addended by: Charlene Brooke on: 03/30/2019 04:14 PM   Modules accepted: Orders

## 2019-04-04 ENCOUNTER — Other Ambulatory Visit: Payer: Self-pay

## 2019-04-04 ENCOUNTER — Telehealth (INDEPENDENT_AMBULATORY_CARE_PROVIDER_SITE_OTHER): Payer: Medicaid Other | Admitting: Urology

## 2019-04-04 NOTE — Progress Notes (Signed)
Attempted to reach patient today for virtual visit.  She did not respond to invite x multiple or calls.  She does have an appointment scheduled for June with renal ultrasound prior.  Let us plan to keep this follow-up, continue to reach out to her to ensure that this is scheduled.  Vanna Scotland, MD

## 2019-04-05 ENCOUNTER — Ambulatory Visit: Payer: Medicaid Other | Admitting: Urology

## 2019-04-06 ENCOUNTER — Ambulatory Visit: Payer: Medicaid Other

## 2019-04-07 ENCOUNTER — Ambulatory Visit: Payer: Medicaid Other

## 2019-04-11 ENCOUNTER — Telehealth: Payer: Self-pay

## 2019-04-11 ENCOUNTER — Ambulatory Visit: Payer: Medicaid Other | Attending: Physician Assistant

## 2019-04-11 DIAGNOSIS — M5412 Radiculopathy, cervical region: Secondary | ICD-10-CM | POA: Insufficient documentation

## 2019-04-11 DIAGNOSIS — M5417 Radiculopathy, lumbosacral region: Secondary | ICD-10-CM | POA: Insufficient documentation

## 2019-04-11 DIAGNOSIS — R262 Difficulty in walking, not elsewhere classified: Secondary | ICD-10-CM | POA: Insufficient documentation

## 2019-04-11 DIAGNOSIS — M6281 Muscle weakness (generalized): Secondary | ICD-10-CM | POA: Insufficient documentation

## 2019-04-11 DIAGNOSIS — M542 Cervicalgia: Secondary | ICD-10-CM | POA: Insufficient documentation

## 2019-04-11 DIAGNOSIS — M545 Low back pain: Secondary | ICD-10-CM | POA: Insufficient documentation

## 2019-04-11 NOTE — Telephone Encounter (Signed)
No show. Called patient who said that her Kathleen Hutchinson died on her. Will be able to make it to her next appointment Wednesday 04/13/2019 at 4 pm. Calling her mom to get a ride.

## 2019-04-13 ENCOUNTER — Ambulatory Visit: Payer: Medicaid Other

## 2019-04-13 ENCOUNTER — Other Ambulatory Visit: Payer: Self-pay

## 2019-04-13 DIAGNOSIS — M542 Cervicalgia: Secondary | ICD-10-CM | POA: Diagnosis not present

## 2019-04-13 DIAGNOSIS — M545 Low back pain, unspecified: Secondary | ICD-10-CM

## 2019-04-13 DIAGNOSIS — M6281 Muscle weakness (generalized): Secondary | ICD-10-CM | POA: Diagnosis not present

## 2019-04-13 DIAGNOSIS — M5412 Radiculopathy, cervical region: Secondary | ICD-10-CM | POA: Diagnosis not present

## 2019-04-13 DIAGNOSIS — R262 Difficulty in walking, not elsewhere classified: Secondary | ICD-10-CM | POA: Diagnosis not present

## 2019-04-13 DIAGNOSIS — M5417 Radiculopathy, lumbosacral region: Secondary | ICD-10-CM | POA: Diagnosis not present

## 2019-04-13 NOTE — Patient Instructions (Signed)
  Seated L scapular retraction. Pt was recommended to continue performing exercise for her L side only at least 10x3 with 5 seconds daily. Pt demonstrated and verbalized understanding.
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
## 2019-04-18 ENCOUNTER — Ambulatory Visit: Payer: Medicaid Other

## 2019-04-20 ENCOUNTER — Ambulatory Visit: Payer: Medicaid Other

## 2019-04-27 ENCOUNTER — Ambulatory Visit: Payer: Medicaid Other

## 2019-04-27 ENCOUNTER — Other Ambulatory Visit: Payer: Self-pay

## 2019-04-27 DIAGNOSIS — M542 Cervicalgia: Secondary | ICD-10-CM

## 2019-04-27 DIAGNOSIS — R262 Difficulty in walking, not elsewhere classified: Secondary | ICD-10-CM | POA: Diagnosis not present

## 2019-04-27 DIAGNOSIS — M6281 Muscle weakness (generalized): Secondary | ICD-10-CM | POA: Diagnosis not present

## 2019-04-27 DIAGNOSIS — M5417 Radiculopathy, lumbosacral region: Secondary | ICD-10-CM | POA: Diagnosis not present

## 2019-04-27 DIAGNOSIS — M545 Low back pain, unspecified: Secondary | ICD-10-CM

## 2019-04-27 DIAGNOSIS — M5412 Radiculopathy, cervical region: Secondary | ICD-10-CM | POA: Diagnosis not present
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
## 2019-05-03 ENCOUNTER — Telehealth: Payer: Self-pay | Admitting: Urology

## 2019-05-03 NOTE — Telephone Encounter (Signed)
Patient called asking if she could come in and be seen for a UTI, she said that she is having frequency and pain in her side. No burning, or other symptoms. Does she need to drop off a UA or be seen?   Kathleen Hutchinson

## 2019-05-03 NOTE — Telephone Encounter (Signed)
LMOM for patient to return call and schedule a lab appointment for a urine to check for UTI. She will need the check off sheet

## 2019-05-09 ENCOUNTER — Other Ambulatory Visit: Payer: Self-pay

## 2019-05-09 ENCOUNTER — Ambulatory Visit: Payer: Medicaid Other | Attending: Physician Assistant

## 2019-05-09 DIAGNOSIS — M5417 Radiculopathy, lumbosacral region: Secondary | ICD-10-CM | POA: Insufficient documentation

## 2019-05-09 DIAGNOSIS — R262 Difficulty in walking, not elsewhere classified: Secondary | ICD-10-CM | POA: Diagnosis not present

## 2019-05-09 DIAGNOSIS — M545 Low back pain: Secondary | ICD-10-CM | POA: Insufficient documentation

## 2019-05-09 DIAGNOSIS — M542 Cervicalgia: Secondary | ICD-10-CM | POA: Diagnosis not present

## 2019-05-09 DIAGNOSIS — M5412 Radiculopathy, cervical region: Secondary | ICD-10-CM | POA: Diagnosis not present

## 2019-05-09 DIAGNOSIS — M25552 Pain in left hip: Secondary | ICD-10-CM | POA: Insufficient documentation

## 2019-05-09 DIAGNOSIS — M6281 Muscle weakness (generalized): Secondary | ICD-10-CM | POA: Insufficient documentation
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
## 2019-05-11 ENCOUNTER — Ambulatory Visit: Payer: Medicaid Other

## 2019-05-16 ENCOUNTER — Ambulatory Visit: Payer: Medicaid Other

## 2019-05-16 ENCOUNTER — Telehealth: Payer: Self-pay

## 2019-05-18 ENCOUNTER — Other Ambulatory Visit: Payer: Self-pay

## 2019-05-18 ENCOUNTER — Ambulatory Visit: Payer: Medicaid Other

## 2019-05-18 DIAGNOSIS — M545 Low back pain: Secondary | ICD-10-CM | POA: Diagnosis not present

## 2019-05-18 DIAGNOSIS — R262 Difficulty in walking, not elsewhere classified: Secondary | ICD-10-CM | POA: Diagnosis not present

## 2019-05-18 DIAGNOSIS — M5412 Radiculopathy, cervical region: Secondary | ICD-10-CM

## 2019-05-18 DIAGNOSIS — M5417 Radiculopathy, lumbosacral region: Secondary | ICD-10-CM

## 2019-05-18 DIAGNOSIS — M6281 Muscle weakness (generalized): Secondary | ICD-10-CM | POA: Diagnosis not present

## 2019-05-18 DIAGNOSIS — M25552 Pain in left hip: Secondary | ICD-10-CM | POA: Diagnosis not present

## 2019-05-18 DIAGNOSIS — M542 Cervicalgia: Secondary | ICD-10-CM
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
## 2019-05-23 ENCOUNTER — Telehealth: Payer: Self-pay

## 2019-05-23 ENCOUNTER — Ambulatory Visit: Payer: Medicaid Other

## 2019-05-23 NOTE — Telephone Encounter (Signed)
No show. Called patient who said that she thought her appointment was on 05/25/2019 and 05/27/2019.  Will be able to make it on her 05/25/2019 appointment.

## 2019-05-24 ENCOUNTER — Telehealth: Payer: Self-pay | Admitting: Urology

## 2019-05-24 DIAGNOSIS — F411 Generalized anxiety disorder: Secondary | ICD-10-CM | POA: Diagnosis not present

## 2019-05-24 DIAGNOSIS — F902 Attention-deficit hyperactivity disorder, combined type: Secondary | ICD-10-CM | POA: Diagnosis not present

## 2019-05-24 NOTE — Telephone Encounter (Signed)
Called pt. To reschedule her appt. With Dr. Erlene Quan due to her RUS not being rescheduled due to Covid. Pt. stated she was going to call us because she is having UTI symptoms, which include burning,and frequent urination. Pt.has been taking over the counter medication that is no longer working and wants to get a prescription.

## 2019-05-24 NOTE — Telephone Encounter (Signed)
She needs to schedule a nurse visit for UA, UCX

## 2019-05-25 ENCOUNTER — Other Ambulatory Visit: Payer: Self-pay

## 2019-05-25 ENCOUNTER — Ambulatory Visit: Payer: Medicaid Other

## 2019-05-25 DIAGNOSIS — M25552 Pain in left hip: Secondary | ICD-10-CM | POA: Diagnosis not present

## 2019-05-25 DIAGNOSIS — M6281 Muscle weakness (generalized): Secondary | ICD-10-CM

## 2019-05-25 DIAGNOSIS — M5412 Radiculopathy, cervical region: Secondary | ICD-10-CM | POA: Diagnosis not present

## 2019-05-25 DIAGNOSIS — M542 Cervicalgia: Secondary | ICD-10-CM | POA: Diagnosis not present

## 2019-05-25 DIAGNOSIS — M5417 Radiculopathy, lumbosacral region: Secondary | ICD-10-CM | POA: Diagnosis not present

## 2019-05-25 DIAGNOSIS — R262 Difficulty in walking, not elsewhere classified: Secondary | ICD-10-CM | POA: Diagnosis not present

## 2019-05-25 DIAGNOSIS — M545 Low back pain: Secondary | ICD-10-CM | POA: Diagnosis not present
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
## 2019-05-25 NOTE — Patient Instructions (Signed)
  Medbridge Access Code: [email or web address]   First Rib Mobilization with Strap  2x10 with 10 second holds

## 2019-05-26 ENCOUNTER — Encounter: Payer: Self-pay | Admitting: *Deleted

## 2019-05-26 ENCOUNTER — Telehealth: Payer: Self-pay | Admitting: *Deleted

## 2019-05-26 ENCOUNTER — Ambulatory Visit (INDEPENDENT_AMBULATORY_CARE_PROVIDER_SITE_OTHER): Payer: Medicaid Other | Admitting: *Deleted

## 2019-05-26 VITALS — BP 114/80 | HR 97 | Ht 68.0 in | Wt 165.0 lb

## 2019-05-26 DIAGNOSIS — N390 Urinary tract infection, site not specified: Secondary | ICD-10-CM | POA: Diagnosis not present

## 2019-05-26 LAB — URINALYSIS, COMPLETE
Bilirubin, UA: NEGATIVE
Glucose, UA: NEGATIVE
Ketones, UA: NEGATIVE
Nitrite, UA: POSITIVE — AB
Protein,UA: NEGATIVE
Specific Gravity, UA: 1.02 (ref 1.005–1.030)
Urobilinogen, Ur: 0.2 mg/dL (ref 0.2–1.0)
pH, UA: 7 (ref 5.0–7.5)

## 2019-05-26 LAB — MICROSCOPIC EXAMINATION: WBC, UA: >30 /hpf — AB (ref 0–5)

## 2019-05-26 LAB — BLADDER SCAN AMB NON-IMAGING

## 2019-05-26 MED ORDER — IBUPROFEN 800 MG PO TABS: 800.0000 mg | ORAL_TABLET | Freq: Three times a day (TID) | ORAL | 0 refills | Status: AC | PRN

## 2019-05-26 MED ORDER — SULFAMETHOXAZOLE-TRIMETHOPRIM 800-160 MG PO TABS: 1.0000 | ORAL_TABLET | Freq: Two times a day (BID) | ORAL | 0 refills | Status: AC

## 2019-05-26 NOTE — Addendum Note (Signed)
Addended by: Verlene Mayer A on: 05/26/2019 02:59 PM   Modules accepted: Orders

## 2019-05-26 NOTE — Progress Notes (Addendum)
Patient came in today for a nurse visit having UTI symptoms. Urinary frequency for one week and Flank pain for three weeks. Denies fever, chills or night sweats. She has been taking Tylenol and Azo with no improvement.

## 2019-05-26 NOTE — Telephone Encounter (Signed)
Informed patient Dr. Erlene Hutchinson wanted her to start Bactrim DS BID for 7 days and sent in Motrin for pain. Verbalized understanding.

## 2019-05-28 LAB — URINE CULTURE

## 2019-06-01 ENCOUNTER — Telehealth: Payer: Self-pay

## 2019-06-01 ENCOUNTER — Ambulatory Visit: Payer: Medicaid Other

## 2019-06-01 NOTE — Telephone Encounter (Signed)
No show. Called patient to check up on her. Pt said that her next appointment is on 06/01/2019 and 06/06/2019. Pt was informed that today is 06/01/2019. Pt said that she will be able to make it to her next appointment on 06/06/2019 at 5 pm.

## 2019-06-02 ENCOUNTER — Other Ambulatory Visit: Payer: Self-pay

## 2019-06-02 ENCOUNTER — Ambulatory Visit: Payer: Medicaid Other

## 2019-06-02 DIAGNOSIS — M542 Cervicalgia: Secondary | ICD-10-CM

## 2019-06-02 DIAGNOSIS — R262 Difficulty in walking, not elsewhere classified: Secondary | ICD-10-CM

## 2019-06-02 DIAGNOSIS — M545 Low back pain, unspecified: Secondary | ICD-10-CM

## 2019-06-02 DIAGNOSIS — M5417 Radiculopathy, lumbosacral region: Secondary | ICD-10-CM

## 2019-06-02 DIAGNOSIS — M6281 Muscle weakness (generalized): Secondary | ICD-10-CM | POA: Diagnosis not present

## 2019-06-02 DIAGNOSIS — M25552 Pain in left hip: Secondary | ICD-10-CM | POA: Diagnosis not present

## 2019-06-02 DIAGNOSIS — M5412 Radiculopathy, cervical region: Secondary | ICD-10-CM | POA: Diagnosis not present
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
## 2019-06-02 NOTE — Patient Instructions (Signed)
Seated chin tucks with chair at wall and 2 pillows behind neck.  Reviewed and given as part of her HEP (10x3 with 5 second holds). Pt demonstrated and verbalized understanding. Picture on pt phone provided.

## 2019-06-06 ENCOUNTER — Other Ambulatory Visit: Payer: Self-pay

## 2019-06-06 ENCOUNTER — Encounter: Payer: Self-pay | Admitting: Physician Assistant

## 2019-06-06 ENCOUNTER — Ambulatory Visit: Payer: Medicaid Other

## 2019-06-06 DIAGNOSIS — M542 Cervicalgia: Secondary | ICD-10-CM

## 2019-06-06 DIAGNOSIS — M5412 Radiculopathy, cervical region: Secondary | ICD-10-CM | POA: Diagnosis not present

## 2019-06-06 DIAGNOSIS — M545 Low back pain, unspecified: Secondary | ICD-10-CM

## 2019-06-06 DIAGNOSIS — M6281 Muscle weakness (generalized): Secondary | ICD-10-CM

## 2019-06-06 DIAGNOSIS — M5417 Radiculopathy, lumbosacral region: Secondary | ICD-10-CM | POA: Diagnosis not present

## 2019-06-06 DIAGNOSIS — J301 Allergic rhinitis due to pollen: Secondary | ICD-10-CM

## 2019-06-06 DIAGNOSIS — R062 Wheezing: Secondary | ICD-10-CM

## 2019-06-06 DIAGNOSIS — M25552 Pain in left hip: Secondary | ICD-10-CM | POA: Diagnosis not present

## 2019-06-06 DIAGNOSIS — R262 Difficulty in walking, not elsewhere classified: Secondary | ICD-10-CM
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
## 2019-06-07 ENCOUNTER — Ambulatory Visit: Payer: Self-pay | Admitting: Urology
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
## 2019-06-07 MED ORDER — ALBUTEROL SULFATE HFA 108 (90 BASE) MCG/ACT IN AERS: 2.0000 | INHALATION_SPRAY | Freq: Four times a day (QID) | RESPIRATORY_TRACT | 1 refills | Status: AC | PRN

## 2019-06-07 MED ORDER — CETIRIZINE HCL 10 MG PO TABS: 10.0000 mg | ORAL_TABLET | Freq: Every day | ORAL | 11 refills | Status: AC

## 2019-06-13 ENCOUNTER — Ambulatory Visit: Payer: Medicaid Other | Attending: Physician Assistant

## 2019-06-13 ENCOUNTER — Other Ambulatory Visit: Payer: Self-pay

## 2019-06-13 ENCOUNTER — Telehealth: Payer: Self-pay

## 2019-06-13 DIAGNOSIS — M6281 Muscle weakness (generalized): Secondary | ICD-10-CM | POA: Diagnosis not present

## 2019-06-13 DIAGNOSIS — M542 Cervicalgia: Secondary | ICD-10-CM | POA: Diagnosis not present

## 2019-06-13 DIAGNOSIS — M5412 Radiculopathy, cervical region: Secondary | ICD-10-CM | POA: Diagnosis not present

## 2019-06-13 DIAGNOSIS — M545 Low back pain, unspecified: Secondary | ICD-10-CM

## 2019-06-13 DIAGNOSIS — M5417 Radiculopathy, lumbosacral region: Secondary | ICD-10-CM | POA: Diagnosis not present

## 2019-06-13 DIAGNOSIS — R262 Difficulty in walking, not elsewhere classified: Secondary | ICD-10-CM | POA: Insufficient documentation

## 2019-06-13 NOTE — Telephone Encounter (Signed)
Miguel advised.
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
## 2019-06-13 NOTE — Telephone Encounter (Signed)
Referral to Dr. Sharlet Salina in orthopedics has been placed and is pending.

## 2019-06-13 NOTE — Telephone Encounter (Signed)
Ashley Pollak's office. Talked to her receptionist Josie and left a message that there is slight improvement in neck and back pain. However, no improvement in R UE symptoms. R hand grip seems to be worse as well. Josie said that she will give the message to Carles Collet PA when she comes to the office.

## 2019-06-13 NOTE — Telephone Encounter (Signed)
Miguel from [facility] reporting that patient's Neck and back are a little better. He reports that he is concerns because patient's wrip is not better is actually worsening. Reports that the numbness is not better and her right upper extremity not any better and strength is worsening. He reports that he is going to try something different for her today but feels like he is out of things to work with her and he was asking if you can send her somewhere else to see what is going on.   CB# [phone]

## 2019-06-13 NOTE — Telephone Encounter (Signed)
Called pt. To schedule her on the nurse schedule for UA,UCX and to let her know we need her to have her RUS done before her appointment with Dr. Erlene Quan on 06/21/19. Left message for pt. To call office.

## 2019-06-13 NOTE — Telephone Encounter (Signed)
Suli from [employer] office [facility] practice) called back and said that a referral to Dr. Sharlet Salina was made. Someone from his office should be calling her.

## 2019-06-13 NOTE — Patient Instructions (Signed)
Pt was recommended to massage her R pectoralis muscle area as well as to continue performing scapular retractions. Also added diaphragmatic breathing (mirror cue to prevent shoulder shrug/chest breathing)  to her HEP. Pt demonstrated and verbalized understanding.

## 2019-06-15 ENCOUNTER — Ambulatory Visit: Payer: Medicaid Other

## 2019-06-17 ENCOUNTER — Ambulatory Visit: Payer: Medicaid Other

## 2019-06-20 ENCOUNTER — Ambulatory Visit: Payer: Medicaid Other

## 2019-06-21 ENCOUNTER — Ambulatory Visit: Payer: Self-pay | Admitting: Urology

## 2019-06-22 ENCOUNTER — Ambulatory Visit: Payer: Medicaid Other

## 2019-06-24 ENCOUNTER — Encounter: Payer: Self-pay | Admitting: Physician Assistant

## 2019-06-27 NOTE — Telephone Encounter (Signed)
Hi Kathleen Hutchinson. Patient reports not being contacted by [facility] clinic for referral placed on 06/07/2019. Can we check on status of this? Thank you.

## 2019-06-29 ENCOUNTER — Ambulatory Visit: Payer: Medicaid Other

## 2019-07-05 ENCOUNTER — Telehealth: Payer: Self-pay

## 2019-07-05 ENCOUNTER — Ambulatory Visit: Payer: Medicaid Other

## 2019-07-05 NOTE — Telephone Encounter (Signed)
No show. Called patient who said that her Kathleen Hutchinson is not working and could not get to MyChart to check her schedule. Will be able to make it to her next two appointments (reviewed next two appointment times and days with pt). Also updated patient that based on the notes, Dr. Sharlet Hutchinson' office tried contacting her on 07/01/2019. Pt might not have been able to receive the call due to phone problems. Provided his office's return call back number: [phone]. Pt said that she will call his office tomorrow when it opens.

## 2019-07-07 DIAGNOSIS — F909 Attention-deficit hyperactivity disorder, unspecified type: Secondary | ICD-10-CM | POA: Diagnosis not present

## 2019-07-07 DIAGNOSIS — F419 Anxiety disorder, unspecified: Secondary | ICD-10-CM | POA: Diagnosis not present

## 2019-07-12 ENCOUNTER — Ambulatory Visit: Payer: Medicaid Other

## 2019-07-14 ENCOUNTER — Ambulatory Visit: Payer: Medicaid Other | Attending: Physician Assistant

## 2019-07-19 ENCOUNTER — Ambulatory Visit: Payer: Medicaid Other

## 2019-07-20 ENCOUNTER — Telehealth: Payer: Self-pay

## 2019-07-20 NOTE — Telephone Encounter (Signed)
No show for appointment on 07/19/2019. Called patient and left a message pertaining to appointment and a reminder for the next follow up session. Return phone call requested. Phone number [phone]) provided.

## 2019-07-21 ENCOUNTER — Telehealth: Payer: Self-pay

## 2019-07-21 ENCOUNTER — Ambulatory Visit: Payer: Medicaid Other

## 2019-07-21 NOTE — Telephone Encounter (Signed)
No show. Called pt and left a message that we might have to remove her from the schedule due to the no show/cancellation policy because of the multiple no shows/missed appointments. Return phone call requested. Phone number [phone]) provided.

## 2019-07-26 ENCOUNTER — Telehealth: Payer: Self-pay

## 2019-07-26 ENCOUNTER — Ambulatory Visit: Payer: Medicaid Other

## 2019-07-26 NOTE — Telephone Encounter (Signed)
No show. Called patient pertaining to today's appointment. Also informed pt that we might have to remove her from the schedule due to the multiple no shows and cancellations per policy. Return phone call requested. Phone number provided [phone])

## 2019-07-28 ENCOUNTER — Ambulatory Visit: Payer: Medicaid Other

## 2019-07-28 DIAGNOSIS — F909 Attention-deficit hyperactivity disorder, unspecified type: Secondary | ICD-10-CM | POA: Diagnosis not present

## 2019-07-28 DIAGNOSIS — F419 Anxiety disorder, unspecified: Secondary | ICD-10-CM | POA: Diagnosis not present

## 2019-08-02 ENCOUNTER — Encounter: Payer: Self-pay | Admitting: Urology

## 2019-08-02 ENCOUNTER — Telehealth: Payer: Self-pay | Admitting: Urology

## 2019-08-02 ENCOUNTER — Other Ambulatory Visit: Payer: Self-pay | Admitting: Urology

## 2019-08-02 DIAGNOSIS — N1370 Vesicoureteral-reflux, unspecified: Secondary | ICD-10-CM

## 2019-08-02 NOTE — Telephone Encounter (Signed)
It is our office policy that we do not give the antibiotics without a UA urine culture.  She will need to come in to drop off a urine.    RUS order placed.    Hollice Espy, MD

## 2019-08-02 NOTE — Telephone Encounter (Signed)
Patient complaint of UTI symptoms.  I offered for her to come to the office for an appointment.  She does not have transportation and is currently homeschooling her children.  She is requesting a prescription for Motrin and antibiotics.    I advised her that she will need to come in to provide a urine sample.    Patient had a 1 year follow up appt with Dr. Erlene Quan with a renal ultrasound prior to the appointment.  She has not completed the ultrasound.  I rescheduled her office visit until 08/24/19 at her request and we will start the prior authorization for the ultrasound.

## 2019-08-02 NOTE — Telephone Encounter (Signed)
I called the patient to let her know that her RUS had been apprived and that it was ready to be scheduled. I was going to offer her an app for a UA and culture but it went to voicemail so I left a message for her to call the office back.  Sharyn Lull

## 2019-08-02 NOTE — Telephone Encounter (Signed)
I spoke with Kathleen Hutchinson in scheduling and her order has expired for the RUS and I will just need another order I have already gotten the PA for it once she gets this she can schedule her.   Thanks, Sharyn Lull

## 2019-08-03 ENCOUNTER — Other Ambulatory Visit: Payer: Self-pay | Admitting: Family Medicine

## 2019-08-03 ENCOUNTER — Telehealth: Payer: Self-pay | Admitting: Urology

## 2019-08-03 ENCOUNTER — Ambulatory Visit: Payer: Self-pay | Admitting: Urology

## 2019-08-03 DIAGNOSIS — N200 Calculus of kidney: Secondary | ICD-10-CM

## 2019-08-03 NOTE — Telephone Encounter (Signed)
Pt called and states that she feels like she has a UTI and she does not have any transportation to come up here and drop off UA. Please advise.

## 2019-08-04 ENCOUNTER — Telehealth: Payer: Self-pay | Admitting: Urology

## 2019-08-04 DIAGNOSIS — F909 Attention-deficit hyperactivity disorder, unspecified type: Secondary | ICD-10-CM | POA: Diagnosis not present

## 2019-08-04 DIAGNOSIS — F419 Anxiety disorder, unspecified: Secondary | ICD-10-CM | POA: Diagnosis not present

## 2019-08-04 NOTE — Telephone Encounter (Signed)
I spoke with Mrs. Kathleen Hutchinson regarding her symptoms of an UTI and the need for her to have her RUS.  I explained that she would need to come to the office to provide a specimen for UA and culture prior to antibiotics being prescribed.  I also expressed my concerns regarding her not having her RUS and KUB.  I explained that it is important that she have these imaging studies completed and follow up with Dr. Erlene Quan.  We want her to have these studies so that we can try an prevent any damage to her kidneys in the future.  She is coming in to see Sam tomorrow for the UTI symptoms.

## 2019-08-04 NOTE — Telephone Encounter (Signed)
Larene [place] spoke to patient. She is scheduled for OV tomorrow for UTI symptoms. She is scheduled for 08/24/19 to see Erlene Quan, she is to have the RUS, KUB done prior.

## 2019-08-05 ENCOUNTER — Ambulatory Visit: Payer: Medicaid Other | Admitting: Physician Assistant

## 2019-08-11 DIAGNOSIS — F909 Attention-deficit hyperactivity disorder, unspecified type: Secondary | ICD-10-CM | POA: Diagnosis not present

## 2019-08-11 DIAGNOSIS — F419 Anxiety disorder, unspecified: Secondary | ICD-10-CM | POA: Diagnosis not present

## 2019-08-16 DIAGNOSIS — R200 Anesthesia of skin: Secondary | ICD-10-CM | POA: Diagnosis not present

## 2019-08-16 DIAGNOSIS — F902 Attention-deficit hyperactivity disorder, combined type: Secondary | ICD-10-CM | POA: Diagnosis not present

## 2019-08-16 DIAGNOSIS — F411 Generalized anxiety disorder: Secondary | ICD-10-CM | POA: Diagnosis not present

## 2019-08-16 DIAGNOSIS — R29898 Other symptoms and signs involving the musculoskeletal system: Secondary | ICD-10-CM | POA: Diagnosis not present

## 2019-08-16 DIAGNOSIS — M4722 Other spondylosis with radiculopathy, cervical region: Secondary | ICD-10-CM | POA: Diagnosis not present

## 2019-08-16 DIAGNOSIS — M25541 Pain in joints of right hand: Secondary | ICD-10-CM | POA: Diagnosis not present

## 2019-08-16 DIAGNOSIS — R202 Paresthesia of skin: Secondary | ICD-10-CM | POA: Diagnosis not present

## 2019-08-17 ENCOUNTER — Ambulatory Visit: Admission: RE | Admit: 2019-08-17 | Payer: Medicaid Other | Source: Ambulatory Visit

## 2019-08-18 ENCOUNTER — Other Ambulatory Visit: Payer: Self-pay | Admitting: Orthopedic Surgery

## 2019-08-18 DIAGNOSIS — M4722 Other spondylosis with radiculopathy, cervical region: Secondary | ICD-10-CM

## 2019-08-19 DIAGNOSIS — F909 Attention-deficit hyperactivity disorder, unspecified type: Secondary | ICD-10-CM | POA: Diagnosis not present

## 2019-08-19 DIAGNOSIS — F419 Anxiety disorder, unspecified: Secondary | ICD-10-CM | POA: Diagnosis not present

## 2019-08-23 ENCOUNTER — Telehealth: Payer: Self-pay | Admitting: *Deleted

## 2019-08-23 NOTE — Telephone Encounter (Signed)
Left patient a VM to return call-she needs her appointment rescheduled. She did not have her RUS or KUB done.

## 2019-08-24 ENCOUNTER — Ambulatory Visit: Payer: Self-pay | Admitting: Urology

## 2019-08-25 DIAGNOSIS — F419 Anxiety disorder, unspecified: Secondary | ICD-10-CM | POA: Diagnosis not present

## 2019-08-25 DIAGNOSIS — F909 Attention-deficit hyperactivity disorder, unspecified type: Secondary | ICD-10-CM | POA: Diagnosis not present

## 2019-08-26 ENCOUNTER — Ambulatory Visit: Payer: Medicaid Other | Attending: Urology

## 2019-09-01 DIAGNOSIS — F909 Attention-deficit hyperactivity disorder, unspecified type: Secondary | ICD-10-CM | POA: Diagnosis not present

## 2019-09-01 DIAGNOSIS — F419 Anxiety disorder, unspecified: Secondary | ICD-10-CM | POA: Diagnosis not present

## 2019-09-08 DIAGNOSIS — F419 Anxiety disorder, unspecified: Secondary | ICD-10-CM | POA: Diagnosis not present

## 2019-09-08 DIAGNOSIS — F909 Attention-deficit hyperactivity disorder, unspecified type: Secondary | ICD-10-CM | POA: Diagnosis not present

## 2019-09-13 ENCOUNTER — Ambulatory Visit
Admission: RE | Admit: 2019-09-13 | Discharge: 2019-09-13 | Disposition: A | Discharge status: Home / Self Care | Payer: Medicaid Other | Source: Ambulatory Visit | Attending: Urology | Admitting: Urology

## 2019-09-13 ENCOUNTER — Other Ambulatory Visit: Payer: Self-pay

## 2019-09-13 ENCOUNTER — Ambulatory Visit
Admission: RE | Admit: 2019-09-13 | Discharge: 2019-09-13 | Disposition: A | Discharge status: Home / Self Care | Payer: Medicaid Other | Source: Ambulatory Visit | Attending: Orthopedic Surgery | Admitting: Orthopedic Surgery

## 2019-09-13 DIAGNOSIS — M4722 Other spondylosis with radiculopathy, cervical region: Secondary | ICD-10-CM | POA: Diagnosis not present

## 2019-09-13 DIAGNOSIS — N1370 Vesicoureteral-reflux, unspecified: Secondary | ICD-10-CM | POA: Diagnosis not present

## 2019-09-13 DIAGNOSIS — M50222 Other cervical disc displacement at C5-C6 level: Secondary | ICD-10-CM | POA: Diagnosis not present

## 2019-09-13 DIAGNOSIS — N2889 Other specified disorders of kidney and ureter: Secondary | ICD-10-CM | POA: Diagnosis not present

## 2019-09-15 DIAGNOSIS — F909 Attention-deficit hyperactivity disorder, unspecified type: Secondary | ICD-10-CM | POA: Diagnosis not present

## 2019-09-15 DIAGNOSIS — F419 Anxiety disorder, unspecified: Secondary | ICD-10-CM | POA: Diagnosis not present

## 2019-09-16 DIAGNOSIS — F909 Attention-deficit hyperactivity disorder, unspecified type: Secondary | ICD-10-CM | POA: Diagnosis not present

## 2019-09-17 DIAGNOSIS — F909 Attention-deficit hyperactivity disorder, unspecified type: Secondary | ICD-10-CM | POA: Diagnosis not present

## 2019-09-20 ENCOUNTER — Telehealth: Payer: Self-pay

## 2019-09-20 DIAGNOSIS — M792 Neuralgia and neuritis, unspecified: Secondary | ICD-10-CM

## 2019-09-20 DIAGNOSIS — F909 Attention-deficit hyperactivity disorder, unspecified type: Secondary | ICD-10-CM | POA: Diagnosis not present

## 2019-09-20 NOTE — Telephone Encounter (Signed)
Please advise 

## 2019-09-20 NOTE — Telephone Encounter (Signed)
I placed a referral to [facility]. I've never had to place an order for an EMG due to insurance, that seems unusual.

## 2019-09-20 NOTE — Telephone Encounter (Signed)
Kathleen Hutchinson with [facility] calling that patient needs a referral to Neurology for EMG (Nerve Damage). Reports that because of patient's insurance that the PCP needs to put referral in and they are not able to do it.  Please Review.

## 2019-09-20 NOTE — Telephone Encounter (Signed)
Patient was advised.  

## 2019-09-22 DIAGNOSIS — F419 Anxiety disorder, unspecified: Secondary | ICD-10-CM | POA: Diagnosis not present

## 2019-09-22 DIAGNOSIS — F909 Attention-deficit hyperactivity disorder, unspecified type: Secondary | ICD-10-CM | POA: Diagnosis not present

## 2019-09-26 DIAGNOSIS — F909 Attention-deficit hyperactivity disorder, unspecified type: Secondary | ICD-10-CM | POA: Diagnosis not present

## 2019-09-27 DIAGNOSIS — F909 Attention-deficit hyperactivity disorder, unspecified type: Secondary | ICD-10-CM | POA: Diagnosis not present

## 2019-09-29 DIAGNOSIS — F909 Attention-deficit hyperactivity disorder, unspecified type: Secondary | ICD-10-CM | POA: Diagnosis not present

## 2019-09-29 DIAGNOSIS — F419 Anxiety disorder, unspecified: Secondary | ICD-10-CM | POA: Diagnosis not present

## 2019-10-05 ENCOUNTER — Ambulatory Visit: Payer: Medicaid Other | Admitting: Urology

## 2019-10-05 DIAGNOSIS — F909 Attention-deficit hyperactivity disorder, unspecified type: Secondary | ICD-10-CM | POA: Diagnosis not present

## 2019-10-06 DIAGNOSIS — F419 Anxiety disorder, unspecified: Secondary | ICD-10-CM | POA: Diagnosis not present

## 2019-10-06 DIAGNOSIS — F909 Attention-deficit hyperactivity disorder, unspecified type: Secondary | ICD-10-CM | POA: Diagnosis not present

## 2019-10-07 DIAGNOSIS — F909 Attention-deficit hyperactivity disorder, unspecified type: Secondary | ICD-10-CM | POA: Diagnosis not present

## 2019-10-12 DIAGNOSIS — F909 Attention-deficit hyperactivity disorder, unspecified type: Secondary | ICD-10-CM | POA: Diagnosis not present

## 2019-10-14 DIAGNOSIS — F909 Attention-deficit hyperactivity disorder, unspecified type: Secondary | ICD-10-CM | POA: Diagnosis not present

## 2019-10-27 DIAGNOSIS — F909 Attention-deficit hyperactivity disorder, unspecified type: Secondary | ICD-10-CM | POA: Diagnosis not present

## 2019-10-27 DIAGNOSIS — F419 Anxiety disorder, unspecified: Secondary | ICD-10-CM | POA: Diagnosis not present

## 2019-10-30 DIAGNOSIS — F909 Attention-deficit hyperactivity disorder, unspecified type: Secondary | ICD-10-CM | POA: Diagnosis not present

## 2019-10-31 DIAGNOSIS — F419 Anxiety disorder, unspecified: Secondary | ICD-10-CM | POA: Diagnosis not present

## 2019-10-31 DIAGNOSIS — F909 Attention-deficit hyperactivity disorder, unspecified type: Secondary | ICD-10-CM | POA: Diagnosis not present

## 2019-11-05 DIAGNOSIS — F909 Attention-deficit hyperactivity disorder, unspecified type: Secondary | ICD-10-CM | POA: Diagnosis not present

## 2019-11-06 DIAGNOSIS — F909 Attention-deficit hyperactivity disorder, unspecified type: Secondary | ICD-10-CM | POA: Diagnosis not present

## 2019-11-10 DIAGNOSIS — F909 Attention-deficit hyperactivity disorder, unspecified type: Secondary | ICD-10-CM | POA: Diagnosis not present

## 2019-11-10 DIAGNOSIS — F419 Anxiety disorder, unspecified: Secondary | ICD-10-CM | POA: Diagnosis not present

## 2019-11-11 DIAGNOSIS — F909 Attention-deficit hyperactivity disorder, unspecified type: Secondary | ICD-10-CM | POA: Diagnosis not present

## 2019-11-13 DIAGNOSIS — F909 Attention-deficit hyperactivity disorder, unspecified type: Secondary | ICD-10-CM | POA: Diagnosis not present

## 2019-11-17 DIAGNOSIS — F419 Anxiety disorder, unspecified: Secondary | ICD-10-CM | POA: Diagnosis not present

## 2019-11-17 DIAGNOSIS — F909 Attention-deficit hyperactivity disorder, unspecified type: Secondary | ICD-10-CM | POA: Diagnosis not present

## 2019-11-18 DIAGNOSIS — F909 Attention-deficit hyperactivity disorder, unspecified type: Secondary | ICD-10-CM | POA: Diagnosis not present

## 2019-11-20 DIAGNOSIS — F909 Attention-deficit hyperactivity disorder, unspecified type: Secondary | ICD-10-CM | POA: Diagnosis not present

## 2019-11-24 DIAGNOSIS — F909 Attention-deficit hyperactivity disorder, unspecified type: Secondary | ICD-10-CM | POA: Diagnosis not present

## 2019-11-24 DIAGNOSIS — F419 Anxiety disorder, unspecified: Secondary | ICD-10-CM | POA: Diagnosis not present

## 2019-11-25 DIAGNOSIS — F909 Attention-deficit hyperactivity disorder, unspecified type: Secondary | ICD-10-CM | POA: Diagnosis not present

## 2019-11-28 DIAGNOSIS — F909 Attention-deficit hyperactivity disorder, unspecified type: Secondary | ICD-10-CM | POA: Diagnosis not present

## 2019-11-30 DIAGNOSIS — F909 Attention-deficit hyperactivity disorder, unspecified type: Secondary | ICD-10-CM | POA: Diagnosis not present

## 2019-12-05 DIAGNOSIS — F909 Attention-deficit hyperactivity disorder, unspecified type: Secondary | ICD-10-CM | POA: Diagnosis not present

## 2019-12-07 DIAGNOSIS — F909 Attention-deficit hyperactivity disorder, unspecified type: Secondary | ICD-10-CM | POA: Diagnosis not present

## 2019-12-12 DIAGNOSIS — F909 Attention-deficit hyperactivity disorder, unspecified type: Secondary | ICD-10-CM | POA: Diagnosis not present

## 2019-12-14 DIAGNOSIS — F909 Attention-deficit hyperactivity disorder, unspecified type: Secondary | ICD-10-CM | POA: Diagnosis not present

## 2019-12-15 DIAGNOSIS — F419 Anxiety disorder, unspecified: Secondary | ICD-10-CM | POA: Diagnosis not present

## 2019-12-15 DIAGNOSIS — F909 Attention-deficit hyperactivity disorder, unspecified type: Secondary | ICD-10-CM | POA: Diagnosis not present

## 2019-12-19 DIAGNOSIS — F909 Attention-deficit hyperactivity disorder, unspecified type: Secondary | ICD-10-CM | POA: Diagnosis not present

## 2019-12-21 DIAGNOSIS — F909 Attention-deficit hyperactivity disorder, unspecified type: Secondary | ICD-10-CM | POA: Diagnosis not present

## 2019-12-27 DIAGNOSIS — F909 Attention-deficit hyperactivity disorder, unspecified type: Secondary | ICD-10-CM | POA: Diagnosis not present

## 2019-12-30 DIAGNOSIS — F909 Attention-deficit hyperactivity disorder, unspecified type: Secondary | ICD-10-CM | POA: Diagnosis not present

## 2020-01-03 DIAGNOSIS — F909 Attention-deficit hyperactivity disorder, unspecified type: Secondary | ICD-10-CM | POA: Diagnosis not present

## 2020-01-06 DIAGNOSIS — F909 Attention-deficit hyperactivity disorder, unspecified type: Secondary | ICD-10-CM | POA: Diagnosis not present

## 2020-01-10 DIAGNOSIS — F909 Attention-deficit hyperactivity disorder, unspecified type: Secondary | ICD-10-CM | POA: Diagnosis not present

## 2020-01-11 DIAGNOSIS — F909 Attention-deficit hyperactivity disorder, unspecified type: Secondary | ICD-10-CM | POA: Diagnosis not present

## 2020-01-12 DIAGNOSIS — F909 Attention-deficit hyperactivity disorder, unspecified type: Secondary | ICD-10-CM | POA: Diagnosis not present

## 2020-01-12 DIAGNOSIS — F419 Anxiety disorder, unspecified: Secondary | ICD-10-CM | POA: Diagnosis not present

## 2020-01-17 DIAGNOSIS — F909 Attention-deficit hyperactivity disorder, unspecified type: Secondary | ICD-10-CM | POA: Diagnosis not present

## 2020-01-20 DIAGNOSIS — F909 Attention-deficit hyperactivity disorder, unspecified type: Secondary | ICD-10-CM | POA: Diagnosis not present

## 2020-01-24 DIAGNOSIS — F909 Attention-deficit hyperactivity disorder, unspecified type: Secondary | ICD-10-CM | POA: Diagnosis not present

## 2020-01-26 DIAGNOSIS — F909 Attention-deficit hyperactivity disorder, unspecified type: Secondary | ICD-10-CM | POA: Diagnosis not present

## 2020-01-31 DIAGNOSIS — F909 Attention-deficit hyperactivity disorder, unspecified type: Secondary | ICD-10-CM | POA: Diagnosis not present

## 2020-02-02 DIAGNOSIS — F909 Attention-deficit hyperactivity disorder, unspecified type: Secondary | ICD-10-CM | POA: Diagnosis not present

## 2020-02-03 DIAGNOSIS — F909 Attention-deficit hyperactivity disorder, unspecified type: Secondary | ICD-10-CM | POA: Diagnosis not present

## 2020-02-03 DIAGNOSIS — F419 Anxiety disorder, unspecified: Secondary | ICD-10-CM | POA: Diagnosis not present

## 2020-02-07 DIAGNOSIS — F909 Attention-deficit hyperactivity disorder, unspecified type: Secondary | ICD-10-CM | POA: Diagnosis not present

## 2020-02-08 DIAGNOSIS — F419 Anxiety disorder, unspecified: Secondary | ICD-10-CM | POA: Diagnosis not present

## 2020-02-08 DIAGNOSIS — F909 Attention-deficit hyperactivity disorder, unspecified type: Secondary | ICD-10-CM | POA: Diagnosis not present

## 2020-02-09 DIAGNOSIS — F909 Attention-deficit hyperactivity disorder, unspecified type: Secondary | ICD-10-CM | POA: Diagnosis not present

## 2020-02-14 DIAGNOSIS — F909 Attention-deficit hyperactivity disorder, unspecified type: Secondary | ICD-10-CM | POA: Diagnosis not present

## 2020-02-15 DIAGNOSIS — F411 Generalized anxiety disorder: Secondary | ICD-10-CM | POA: Diagnosis not present

## 2020-02-15 DIAGNOSIS — F902 Attention-deficit hyperactivity disorder, combined type: Secondary | ICD-10-CM | POA: Diagnosis not present

## 2020-02-16 DIAGNOSIS — F909 Attention-deficit hyperactivity disorder, unspecified type: Secondary | ICD-10-CM | POA: Diagnosis not present

## 2020-02-23 DIAGNOSIS — F419 Anxiety disorder, unspecified: Secondary | ICD-10-CM | POA: Diagnosis not present

## 2020-02-23 DIAGNOSIS — F909 Attention-deficit hyperactivity disorder, unspecified type: Secondary | ICD-10-CM | POA: Diagnosis not present

## 2020-03-14 DIAGNOSIS — F419 Anxiety disorder, unspecified: Secondary | ICD-10-CM | POA: Diagnosis not present

## 2020-03-14 DIAGNOSIS — F909 Attention-deficit hyperactivity disorder, unspecified type: Secondary | ICD-10-CM | POA: Diagnosis not present

## 2020-04-03 DIAGNOSIS — F909 Attention-deficit hyperactivity disorder, unspecified type: Secondary | ICD-10-CM | POA: Diagnosis not present

## 2020-04-05 DIAGNOSIS — F909 Attention-deficit hyperactivity disorder, unspecified type: Secondary | ICD-10-CM | POA: Diagnosis not present

## 2020-04-06 DIAGNOSIS — F419 Anxiety disorder, unspecified: Secondary | ICD-10-CM | POA: Diagnosis not present

## 2020-04-06 DIAGNOSIS — F909 Attention-deficit hyperactivity disorder, unspecified type: Secondary | ICD-10-CM | POA: Diagnosis not present

## 2020-04-10 DIAGNOSIS — F909 Attention-deficit hyperactivity disorder, unspecified type: Secondary | ICD-10-CM | POA: Diagnosis not present

## 2020-04-12 DIAGNOSIS — F909 Attention-deficit hyperactivity disorder, unspecified type: Secondary | ICD-10-CM | POA: Diagnosis not present

## 2020-04-13 DIAGNOSIS — F419 Anxiety disorder, unspecified: Secondary | ICD-10-CM | POA: Diagnosis not present

## 2020-04-13 DIAGNOSIS — F909 Attention-deficit hyperactivity disorder, unspecified type: Secondary | ICD-10-CM | POA: Diagnosis not present

## 2020-04-17 DIAGNOSIS — F909 Attention-deficit hyperactivity disorder, unspecified type: Secondary | ICD-10-CM | POA: Diagnosis not present

## 2020-04-19 DIAGNOSIS — F909 Attention-deficit hyperactivity disorder, unspecified type: Secondary | ICD-10-CM | POA: Diagnosis not present

## 2020-04-19 DIAGNOSIS — F419 Anxiety disorder, unspecified: Secondary | ICD-10-CM | POA: Diagnosis not present

## 2020-04-24 DIAGNOSIS — F909 Attention-deficit hyperactivity disorder, unspecified type: Secondary | ICD-10-CM | POA: Diagnosis not present

## 2020-04-26 DIAGNOSIS — F909 Attention-deficit hyperactivity disorder, unspecified type: Secondary | ICD-10-CM | POA: Diagnosis not present

## 2020-05-01 DIAGNOSIS — F909 Attention-deficit hyperactivity disorder, unspecified type: Secondary | ICD-10-CM | POA: Diagnosis not present

## 2020-05-03 DIAGNOSIS — F909 Attention-deficit hyperactivity disorder, unspecified type: Secondary | ICD-10-CM | POA: Diagnosis not present

## 2020-05-04 DIAGNOSIS — F909 Attention-deficit hyperactivity disorder, unspecified type: Secondary | ICD-10-CM | POA: Diagnosis not present

## 2020-05-04 DIAGNOSIS — F419 Anxiety disorder, unspecified: Secondary | ICD-10-CM | POA: Diagnosis not present

## 2020-05-08 DIAGNOSIS — F909 Attention-deficit hyperactivity disorder, unspecified type: Secondary | ICD-10-CM | POA: Diagnosis not present

## 2020-05-10 DIAGNOSIS — F419 Anxiety disorder, unspecified: Secondary | ICD-10-CM | POA: Diagnosis not present

## 2020-05-10 DIAGNOSIS — F909 Attention-deficit hyperactivity disorder, unspecified type: Secondary | ICD-10-CM | POA: Diagnosis not present

## 2020-05-15 DIAGNOSIS — F909 Attention-deficit hyperactivity disorder, unspecified type: Secondary | ICD-10-CM | POA: Diagnosis not present

## 2020-05-17 DIAGNOSIS — F909 Attention-deficit hyperactivity disorder, unspecified type: Secondary | ICD-10-CM | POA: Diagnosis not present

## 2020-05-22 DIAGNOSIS — F909 Attention-deficit hyperactivity disorder, unspecified type: Secondary | ICD-10-CM | POA: Diagnosis not present

## 2020-05-24 DIAGNOSIS — F909 Attention-deficit hyperactivity disorder, unspecified type: Secondary | ICD-10-CM | POA: Diagnosis not present

## 2020-05-25 DIAGNOSIS — F909 Attention-deficit hyperactivity disorder, unspecified type: Secondary | ICD-10-CM | POA: Diagnosis not present

## 2020-05-25 DIAGNOSIS — F419 Anxiety disorder, unspecified: Secondary | ICD-10-CM | POA: Diagnosis not present

## 2020-05-30 DIAGNOSIS — F909 Attention-deficit hyperactivity disorder, unspecified type: Secondary | ICD-10-CM | POA: Diagnosis not present

## 2020-06-01 DIAGNOSIS — F909 Attention-deficit hyperactivity disorder, unspecified type: Secondary | ICD-10-CM | POA: Diagnosis not present

## 2020-06-05 DIAGNOSIS — F909 Attention-deficit hyperactivity disorder, unspecified type: Secondary | ICD-10-CM | POA: Diagnosis not present

## 2020-07-19 DIAGNOSIS — F909 Attention-deficit hyperactivity disorder, unspecified type: Secondary | ICD-10-CM | POA: Diagnosis not present

## 2020-07-20 DIAGNOSIS — F909 Attention-deficit hyperactivity disorder, unspecified type: Secondary | ICD-10-CM | POA: Diagnosis not present

## 2020-07-25 DIAGNOSIS — F909 Attention-deficit hyperactivity disorder, unspecified type: Secondary | ICD-10-CM | POA: Diagnosis not present

## 2020-07-25 DIAGNOSIS — F419 Anxiety disorder, unspecified: Secondary | ICD-10-CM | POA: Diagnosis not present

## 2020-07-27 DIAGNOSIS — F909 Attention-deficit hyperactivity disorder, unspecified type: Secondary | ICD-10-CM | POA: Diagnosis not present

## 2020-08-02 DIAGNOSIS — F909 Attention-deficit hyperactivity disorder, unspecified type: Secondary | ICD-10-CM | POA: Diagnosis not present

## 2020-08-03 DIAGNOSIS — F909 Attention-deficit hyperactivity disorder, unspecified type: Secondary | ICD-10-CM | POA: Diagnosis not present

## 2020-08-07 DIAGNOSIS — F909 Attention-deficit hyperactivity disorder, unspecified type: Secondary | ICD-10-CM | POA: Diagnosis not present

## 2020-08-10 DIAGNOSIS — F909 Attention-deficit hyperactivity disorder, unspecified type: Secondary | ICD-10-CM | POA: Diagnosis not present

## 2020-08-14 DIAGNOSIS — F909 Attention-deficit hyperactivity disorder, unspecified type: Secondary | ICD-10-CM | POA: Diagnosis not present

## 2020-08-16 DIAGNOSIS — F909 Attention-deficit hyperactivity disorder, unspecified type: Secondary | ICD-10-CM | POA: Diagnosis not present

## 2020-08-20 DIAGNOSIS — F909 Attention-deficit hyperactivity disorder, unspecified type: Secondary | ICD-10-CM | POA: Diagnosis not present

## 2020-08-29 DIAGNOSIS — F909 Attention-deficit hyperactivity disorder, unspecified type: Secondary | ICD-10-CM | POA: Diagnosis not present

## 2020-08-31 DIAGNOSIS — F909 Attention-deficit hyperactivity disorder, unspecified type: Secondary | ICD-10-CM | POA: Diagnosis not present

## 2020-09-03 DIAGNOSIS — F909 Attention-deficit hyperactivity disorder, unspecified type: Secondary | ICD-10-CM | POA: Diagnosis not present

## 2020-09-04 DIAGNOSIS — F909 Attention-deficit hyperactivity disorder, unspecified type: Secondary | ICD-10-CM | POA: Diagnosis not present

## 2020-09-06 DIAGNOSIS — F419 Anxiety disorder, unspecified: Secondary | ICD-10-CM | POA: Diagnosis not present

## 2020-09-06 DIAGNOSIS — F909 Attention-deficit hyperactivity disorder, unspecified type: Secondary | ICD-10-CM | POA: Diagnosis not present

## 2020-09-11 DIAGNOSIS — F909 Attention-deficit hyperactivity disorder, unspecified type: Secondary | ICD-10-CM | POA: Diagnosis not present

## 2020-09-12 DIAGNOSIS — F419 Anxiety disorder, unspecified: Secondary | ICD-10-CM | POA: Diagnosis not present

## 2020-09-12 DIAGNOSIS — F909 Attention-deficit hyperactivity disorder, unspecified type: Secondary | ICD-10-CM | POA: Diagnosis not present

## 2020-09-25 DIAGNOSIS — F909 Attention-deficit hyperactivity disorder, unspecified type: Secondary | ICD-10-CM | POA: Diagnosis not present

## 2020-09-25 DIAGNOSIS — F411 Generalized anxiety disorder: Secondary | ICD-10-CM | POA: Diagnosis not present

## 2020-09-25 DIAGNOSIS — F902 Attention-deficit hyperactivity disorder, combined type: Secondary | ICD-10-CM | POA: Diagnosis not present

## 2020-09-25 DIAGNOSIS — F419 Anxiety disorder, unspecified: Secondary | ICD-10-CM | POA: Diagnosis not present

## 2020-10-04 DIAGNOSIS — F419 Anxiety disorder, unspecified: Secondary | ICD-10-CM | POA: Diagnosis not present

## 2020-10-04 DIAGNOSIS — F909 Attention-deficit hyperactivity disorder, unspecified type: Secondary | ICD-10-CM | POA: Diagnosis not present

## 2020-10-05 DIAGNOSIS — F909 Attention-deficit hyperactivity disorder, unspecified type: Secondary | ICD-10-CM | POA: Diagnosis not present

## 2020-10-05 DIAGNOSIS — F419 Anxiety disorder, unspecified: Secondary | ICD-10-CM | POA: Diagnosis not present

## 2020-10-15 DIAGNOSIS — F419 Anxiety disorder, unspecified: Secondary | ICD-10-CM | POA: Diagnosis not present

## 2020-10-15 DIAGNOSIS — F909 Attention-deficit hyperactivity disorder, unspecified type: Secondary | ICD-10-CM | POA: Diagnosis not present

## 2020-10-18 DIAGNOSIS — F909 Attention-deficit hyperactivity disorder, unspecified type: Secondary | ICD-10-CM | POA: Diagnosis not present

## 2020-10-19 DIAGNOSIS — F909 Attention-deficit hyperactivity disorder, unspecified type: Secondary | ICD-10-CM | POA: Diagnosis not present

## 2020-10-19 DIAGNOSIS — F419 Anxiety disorder, unspecified: Secondary | ICD-10-CM | POA: Diagnosis not present

## 2020-10-22 DIAGNOSIS — F419 Anxiety disorder, unspecified: Secondary | ICD-10-CM | POA: Diagnosis not present

## 2020-10-22 DIAGNOSIS — F909 Attention-deficit hyperactivity disorder, unspecified type: Secondary | ICD-10-CM | POA: Diagnosis not present

## 2020-10-30 DIAGNOSIS — F909 Attention-deficit hyperactivity disorder, unspecified type: Secondary | ICD-10-CM | POA: Diagnosis not present

## 2020-10-31 DIAGNOSIS — F419 Anxiety disorder, unspecified: Secondary | ICD-10-CM | POA: Diagnosis not present

## 2020-10-31 DIAGNOSIS — F909 Attention-deficit hyperactivity disorder, unspecified type: Secondary | ICD-10-CM | POA: Diagnosis not present

## 2020-11-07 DIAGNOSIS — F909 Attention-deficit hyperactivity disorder, unspecified type: Secondary | ICD-10-CM | POA: Diagnosis not present

## 2020-11-07 DIAGNOSIS — F419 Anxiety disorder, unspecified: Secondary | ICD-10-CM | POA: Diagnosis not present

## 2020-11-08 DIAGNOSIS — F909 Attention-deficit hyperactivity disorder, unspecified type: Secondary | ICD-10-CM | POA: Diagnosis not present

## 2020-11-08 DIAGNOSIS — F419 Anxiety disorder, unspecified: Secondary | ICD-10-CM | POA: Diagnosis not present

## 2020-11-12 DIAGNOSIS — F909 Attention-deficit hyperactivity disorder, unspecified type: Secondary | ICD-10-CM | POA: Diagnosis not present

## 2020-11-14 DIAGNOSIS — F909 Attention-deficit hyperactivity disorder, unspecified type: Secondary | ICD-10-CM | POA: Diagnosis not present

## 2020-11-21 DIAGNOSIS — F909 Attention-deficit hyperactivity disorder, unspecified type: Secondary | ICD-10-CM | POA: Diagnosis not present

## 2020-11-22 DIAGNOSIS — F909 Attention-deficit hyperactivity disorder, unspecified type: Secondary | ICD-10-CM | POA: Diagnosis not present

## 2020-11-25 DIAGNOSIS — F909 Attention-deficit hyperactivity disorder, unspecified type: Secondary | ICD-10-CM | POA: Diagnosis not present

## 2020-11-26 DIAGNOSIS — F909 Attention-deficit hyperactivity disorder, unspecified type: Secondary | ICD-10-CM | POA: Diagnosis not present

## 2020-12-03 DIAGNOSIS — F909 Attention-deficit hyperactivity disorder, unspecified type: Secondary | ICD-10-CM | POA: Diagnosis not present

## 2020-12-04 DIAGNOSIS — F909 Attention-deficit hyperactivity disorder, unspecified type: Secondary | ICD-10-CM | POA: Diagnosis not present

## 2020-12-10 DIAGNOSIS — F909 Attention-deficit hyperactivity disorder, unspecified type: Secondary | ICD-10-CM | POA: Diagnosis not present

## 2020-12-13 DIAGNOSIS — F909 Attention-deficit hyperactivity disorder, unspecified type: Secondary | ICD-10-CM | POA: Diagnosis not present

## 2020-12-26 DIAGNOSIS — F909 Attention-deficit hyperactivity disorder, unspecified type: Secondary | ICD-10-CM | POA: Diagnosis not present

## 2020-12-28 DIAGNOSIS — F909 Attention-deficit hyperactivity disorder, unspecified type: Secondary | ICD-10-CM | POA: Diagnosis not present

## 2021-01-02 DIAGNOSIS — F909 Attention-deficit hyperactivity disorder, unspecified type: Secondary | ICD-10-CM | POA: Diagnosis not present

## 2021-01-04 DIAGNOSIS — F909 Attention-deficit hyperactivity disorder, unspecified type: Secondary | ICD-10-CM | POA: Diagnosis not present

## 2021-01-10 DIAGNOSIS — F909 Attention-deficit hyperactivity disorder, unspecified type: Secondary | ICD-10-CM | POA: Diagnosis not present

## 2021-01-11 DIAGNOSIS — F909 Attention-deficit hyperactivity disorder, unspecified type: Secondary | ICD-10-CM | POA: Diagnosis not present

## 2021-01-17 DIAGNOSIS — F909 Attention-deficit hyperactivity disorder, unspecified type: Secondary | ICD-10-CM | POA: Diagnosis not present

## 2021-01-18 DIAGNOSIS — F909 Attention-deficit hyperactivity disorder, unspecified type: Secondary | ICD-10-CM | POA: Diagnosis not present

## 2021-01-23 DIAGNOSIS — F909 Attention-deficit hyperactivity disorder, unspecified type: Secondary | ICD-10-CM | POA: Diagnosis not present

## 2021-01-29 DIAGNOSIS — F909 Attention-deficit hyperactivity disorder, unspecified type: Secondary | ICD-10-CM | POA: Diagnosis not present

## 2021-01-31 DIAGNOSIS — H5213 Myopia, bilateral: Secondary | ICD-10-CM | POA: Diagnosis not present

## 2021-02-01 DIAGNOSIS — F909 Attention-deficit hyperactivity disorder, unspecified type: Secondary | ICD-10-CM | POA: Diagnosis not present

## 2021-02-05 DIAGNOSIS — F419 Anxiety disorder, unspecified: Secondary | ICD-10-CM | POA: Diagnosis not present

## 2021-02-05 DIAGNOSIS — F909 Attention-deficit hyperactivity disorder, unspecified type: Secondary | ICD-10-CM | POA: Diagnosis not present

## 2021-02-06 DIAGNOSIS — F909 Attention-deficit hyperactivity disorder, unspecified type: Secondary | ICD-10-CM | POA: Diagnosis not present

## 2021-02-08 DIAGNOSIS — F909 Attention-deficit hyperactivity disorder, unspecified type: Secondary | ICD-10-CM | POA: Diagnosis not present

## 2021-02-11 DIAGNOSIS — F909 Attention-deficit hyperactivity disorder, unspecified type: Secondary | ICD-10-CM | POA: Diagnosis not present

## 2021-02-13 DIAGNOSIS — F909 Attention-deficit hyperactivity disorder, unspecified type: Secondary | ICD-10-CM | POA: Diagnosis not present

## 2021-02-26 DIAGNOSIS — F411 Generalized anxiety disorder: Secondary | ICD-10-CM | POA: Diagnosis not present

## 2021-02-26 DIAGNOSIS — F902 Attention-deficit hyperactivity disorder, combined type: Secondary | ICD-10-CM | POA: Diagnosis not present

## 2021-02-26 DIAGNOSIS — Z79899 Other long term (current) drug therapy: Secondary | ICD-10-CM | POA: Diagnosis not present

## 2021-03-05 DIAGNOSIS — F909 Attention-deficit hyperactivity disorder, unspecified type: Secondary | ICD-10-CM | POA: Diagnosis not present

## 2021-03-06 DIAGNOSIS — F909 Attention-deficit hyperactivity disorder, unspecified type: Secondary | ICD-10-CM | POA: Diagnosis not present

## 2021-03-06 DIAGNOSIS — F419 Anxiety disorder, unspecified: Secondary | ICD-10-CM | POA: Diagnosis not present

## 2021-03-08 DIAGNOSIS — F909 Attention-deficit hyperactivity disorder, unspecified type: Secondary | ICD-10-CM | POA: Diagnosis not present

## 2021-03-11 DIAGNOSIS — F909 Attention-deficit hyperactivity disorder, unspecified type: Secondary | ICD-10-CM | POA: Diagnosis not present

## 2021-03-12 DIAGNOSIS — F419 Anxiety disorder, unspecified: Secondary | ICD-10-CM | POA: Diagnosis not present

## 2021-03-12 DIAGNOSIS — F909 Attention-deficit hyperactivity disorder, unspecified type: Secondary | ICD-10-CM | POA: Diagnosis not present

## 2021-03-13 DIAGNOSIS — F909 Attention-deficit hyperactivity disorder, unspecified type: Secondary | ICD-10-CM | POA: Diagnosis not present

## 2021-03-18 DIAGNOSIS — F909 Attention-deficit hyperactivity disorder, unspecified type: Secondary | ICD-10-CM | POA: Diagnosis not present

## 2021-03-20 DIAGNOSIS — F909 Attention-deficit hyperactivity disorder, unspecified type: Secondary | ICD-10-CM | POA: Diagnosis not present

## 2021-03-25 DIAGNOSIS — F909 Attention-deficit hyperactivity disorder, unspecified type: Secondary | ICD-10-CM | POA: Diagnosis not present

## 2021-03-27 DIAGNOSIS — F909 Attention-deficit hyperactivity disorder, unspecified type: Secondary | ICD-10-CM | POA: Diagnosis not present

## 2021-04-01 DIAGNOSIS — F909 Attention-deficit hyperactivity disorder, unspecified type: Secondary | ICD-10-CM | POA: Diagnosis not present

## 2021-04-03 DIAGNOSIS — F909 Attention-deficit hyperactivity disorder, unspecified type: Secondary | ICD-10-CM | POA: Diagnosis not present

## 2021-04-08 DIAGNOSIS — F909 Attention-deficit hyperactivity disorder, unspecified type: Secondary | ICD-10-CM | POA: Diagnosis not present

## 2021-04-09 DIAGNOSIS — F909 Attention-deficit hyperactivity disorder, unspecified type: Secondary | ICD-10-CM | POA: Diagnosis not present

## 2021-04-09 DIAGNOSIS — F419 Anxiety disorder, unspecified: Secondary | ICD-10-CM | POA: Diagnosis not present

## 2021-04-16 DIAGNOSIS — F909 Attention-deficit hyperactivity disorder, unspecified type: Secondary | ICD-10-CM | POA: Diagnosis not present

## 2021-04-18 DIAGNOSIS — F909 Attention-deficit hyperactivity disorder, unspecified type: Secondary | ICD-10-CM | POA: Diagnosis not present

## 2021-04-23 DIAGNOSIS — F909 Attention-deficit hyperactivity disorder, unspecified type: Secondary | ICD-10-CM | POA: Diagnosis not present

## 2021-04-24 DIAGNOSIS — F909 Attention-deficit hyperactivity disorder, unspecified type: Secondary | ICD-10-CM | POA: Diagnosis not present

## 2021-04-30 DIAGNOSIS — F909 Attention-deficit hyperactivity disorder, unspecified type: Secondary | ICD-10-CM | POA: Diagnosis not present

## 2021-05-02 DIAGNOSIS — F909 Attention-deficit hyperactivity disorder, unspecified type: Secondary | ICD-10-CM | POA: Diagnosis not present

## 2021-05-07 DIAGNOSIS — F909 Attention-deficit hyperactivity disorder, unspecified type: Secondary | ICD-10-CM | POA: Diagnosis not present

## 2021-05-08 DIAGNOSIS — F909 Attention-deficit hyperactivity disorder, unspecified type: Secondary | ICD-10-CM | POA: Diagnosis not present

## 2021-05-13 DIAGNOSIS — F909 Attention-deficit hyperactivity disorder, unspecified type: Secondary | ICD-10-CM | POA: Diagnosis not present

## 2021-05-14 DIAGNOSIS — F419 Anxiety disorder, unspecified: Secondary | ICD-10-CM | POA: Diagnosis not present

## 2021-05-14 DIAGNOSIS — F909 Attention-deficit hyperactivity disorder, unspecified type: Secondary | ICD-10-CM | POA: Diagnosis not present

## 2021-05-15 DIAGNOSIS — F909 Attention-deficit hyperactivity disorder, unspecified type: Secondary | ICD-10-CM | POA: Diagnosis not present

## 2021-05-22 DIAGNOSIS — F909 Attention-deficit hyperactivity disorder, unspecified type: Secondary | ICD-10-CM | POA: Diagnosis not present

## 2021-05-24 DIAGNOSIS — F909 Attention-deficit hyperactivity disorder, unspecified type: Secondary | ICD-10-CM | POA: Diagnosis not present

## 2021-05-28 DIAGNOSIS — F909 Attention-deficit hyperactivity disorder, unspecified type: Secondary | ICD-10-CM | POA: Diagnosis not present

## 2021-05-30 DIAGNOSIS — F909 Attention-deficit hyperactivity disorder, unspecified type: Secondary | ICD-10-CM | POA: Diagnosis not present

## 2021-06-04 DIAGNOSIS — F909 Attention-deficit hyperactivity disorder, unspecified type: Secondary | ICD-10-CM | POA: Diagnosis not present

## 2021-06-05 DIAGNOSIS — F909 Attention-deficit hyperactivity disorder, unspecified type: Secondary | ICD-10-CM | POA: Diagnosis not present

## 2021-06-05 DIAGNOSIS — F419 Anxiety disorder, unspecified: Secondary | ICD-10-CM | POA: Diagnosis not present

## 2021-06-06 DIAGNOSIS — F909 Attention-deficit hyperactivity disorder, unspecified type: Secondary | ICD-10-CM | POA: Diagnosis not present

## 2021-06-11 DIAGNOSIS — F419 Anxiety disorder, unspecified: Secondary | ICD-10-CM | POA: Diagnosis not present

## 2021-06-11 DIAGNOSIS — F909 Attention-deficit hyperactivity disorder, unspecified type: Secondary | ICD-10-CM | POA: Diagnosis not present

## 2021-06-13 DIAGNOSIS — F909 Attention-deficit hyperactivity disorder, unspecified type: Secondary | ICD-10-CM | POA: Diagnosis not present

## 2021-06-18 DIAGNOSIS — F909 Attention-deficit hyperactivity disorder, unspecified type: Secondary | ICD-10-CM | POA: Diagnosis not present

## 2021-06-21 DIAGNOSIS — F909 Attention-deficit hyperactivity disorder, unspecified type: Secondary | ICD-10-CM | POA: Diagnosis not present

## 2021-06-25 DIAGNOSIS — F909 Attention-deficit hyperactivity disorder, unspecified type: Secondary | ICD-10-CM | POA: Diagnosis not present

## 2021-06-27 DIAGNOSIS — F909 Attention-deficit hyperactivity disorder, unspecified type: Secondary | ICD-10-CM | POA: Diagnosis not present

## 2021-06-27 DIAGNOSIS — F419 Anxiety disorder, unspecified: Secondary | ICD-10-CM | POA: Diagnosis not present

## 2021-07-01 DIAGNOSIS — F909 Attention-deficit hyperactivity disorder, unspecified type: Secondary | ICD-10-CM | POA: Diagnosis not present

## 2021-07-02 DIAGNOSIS — F419 Anxiety disorder, unspecified: Secondary | ICD-10-CM | POA: Diagnosis not present

## 2021-07-02 DIAGNOSIS — F909 Attention-deficit hyperactivity disorder, unspecified type: Secondary | ICD-10-CM | POA: Diagnosis not present

## 2021-07-04 DIAGNOSIS — F909 Attention-deficit hyperactivity disorder, unspecified type: Secondary | ICD-10-CM | POA: Diagnosis not present

## 2021-07-09 DIAGNOSIS — F419 Anxiety disorder, unspecified: Secondary | ICD-10-CM | POA: Diagnosis not present

## 2021-07-09 DIAGNOSIS — F909 Attention-deficit hyperactivity disorder, unspecified type: Secondary | ICD-10-CM | POA: Diagnosis not present

## 2021-07-11 DIAGNOSIS — F909 Attention-deficit hyperactivity disorder, unspecified type: Secondary | ICD-10-CM | POA: Diagnosis not present

## 2021-07-12 DIAGNOSIS — F902 Attention-deficit hyperactivity disorder, combined type: Secondary | ICD-10-CM | POA: Diagnosis not present

## 2021-07-12 DIAGNOSIS — F411 Generalized anxiety disorder: Secondary | ICD-10-CM | POA: Diagnosis not present

## 2021-07-15 DIAGNOSIS — F909 Attention-deficit hyperactivity disorder, unspecified type: Secondary | ICD-10-CM | POA: Diagnosis not present

## 2021-07-19 DIAGNOSIS — F909 Attention-deficit hyperactivity disorder, unspecified type: Secondary | ICD-10-CM | POA: Diagnosis not present

## 2021-07-24 DIAGNOSIS — F909 Attention-deficit hyperactivity disorder, unspecified type: Secondary | ICD-10-CM | POA: Diagnosis not present

## 2021-07-26 DIAGNOSIS — F909 Attention-deficit hyperactivity disorder, unspecified type: Secondary | ICD-10-CM | POA: Diagnosis not present

## 2021-07-29 DIAGNOSIS — F909 Attention-deficit hyperactivity disorder, unspecified type: Secondary | ICD-10-CM | POA: Diagnosis not present

## 2021-08-01 DIAGNOSIS — F419 Anxiety disorder, unspecified: Secondary | ICD-10-CM | POA: Diagnosis not present

## 2021-08-01 DIAGNOSIS — F909 Attention-deficit hyperactivity disorder, unspecified type: Secondary | ICD-10-CM | POA: Diagnosis not present

## 2021-08-05 DIAGNOSIS — F909 Attention-deficit hyperactivity disorder, unspecified type: Secondary | ICD-10-CM | POA: Diagnosis not present

## 2021-08-08 DIAGNOSIS — F909 Attention-deficit hyperactivity disorder, unspecified type: Secondary | ICD-10-CM | POA: Diagnosis not present

## 2021-08-14 DIAGNOSIS — F909 Attention-deficit hyperactivity disorder, unspecified type: Secondary | ICD-10-CM | POA: Diagnosis not present

## 2021-08-16 DIAGNOSIS — F909 Attention-deficit hyperactivity disorder, unspecified type: Secondary | ICD-10-CM | POA: Diagnosis not present

## 2021-08-19 DIAGNOSIS — F909 Attention-deficit hyperactivity disorder, unspecified type: Secondary | ICD-10-CM | POA: Diagnosis not present

## 2021-08-20 DIAGNOSIS — F909 Attention-deficit hyperactivity disorder, unspecified type: Secondary | ICD-10-CM | POA: Diagnosis not present

## 2021-08-22 DIAGNOSIS — F909 Attention-deficit hyperactivity disorder, unspecified type: Secondary | ICD-10-CM | POA: Diagnosis not present

## 2021-08-22 DIAGNOSIS — F419 Anxiety disorder, unspecified: Secondary | ICD-10-CM | POA: Diagnosis not present

## 2021-09-03 DIAGNOSIS — F419 Anxiety disorder, unspecified: Secondary | ICD-10-CM | POA: Diagnosis not present

## 2021-09-03 DIAGNOSIS — F909 Attention-deficit hyperactivity disorder, unspecified type: Secondary | ICD-10-CM | POA: Diagnosis not present

## 2021-09-06 DIAGNOSIS — F909 Attention-deficit hyperactivity disorder, unspecified type: Secondary | ICD-10-CM | POA: Diagnosis not present

## 2021-09-07 DIAGNOSIS — F909 Attention-deficit hyperactivity disorder, unspecified type: Secondary | ICD-10-CM | POA: Diagnosis not present

## 2021-09-09 DIAGNOSIS — F909 Attention-deficit hyperactivity disorder, unspecified type: Secondary | ICD-10-CM | POA: Diagnosis not present

## 2021-09-11 DIAGNOSIS — F909 Attention-deficit hyperactivity disorder, unspecified type: Secondary | ICD-10-CM | POA: Diagnosis not present

## 2021-09-21 DIAGNOSIS — F909 Attention-deficit hyperactivity disorder, unspecified type: Secondary | ICD-10-CM | POA: Diagnosis not present

## 2021-09-23 DIAGNOSIS — F909 Attention-deficit hyperactivity disorder, unspecified type: Secondary | ICD-10-CM | POA: Diagnosis not present

## 2021-09-25 DIAGNOSIS — F909 Attention-deficit hyperactivity disorder, unspecified type: Secondary | ICD-10-CM | POA: Diagnosis not present

## 2021-10-04 DIAGNOSIS — F909 Attention-deficit hyperactivity disorder, unspecified type: Secondary | ICD-10-CM | POA: Diagnosis not present

## 2021-10-05 DIAGNOSIS — F909 Attention-deficit hyperactivity disorder, unspecified type: Secondary | ICD-10-CM | POA: Diagnosis not present

## 2021-10-08 DIAGNOSIS — F909 Attention-deficit hyperactivity disorder, unspecified type: Secondary | ICD-10-CM | POA: Diagnosis not present

## 2021-10-10 DIAGNOSIS — F909 Attention-deficit hyperactivity disorder, unspecified type: Secondary | ICD-10-CM | POA: Diagnosis not present

## 2021-10-14 DIAGNOSIS — F909 Attention-deficit hyperactivity disorder, unspecified type: Secondary | ICD-10-CM | POA: Diagnosis not present

## 2021-10-15 DIAGNOSIS — F909 Attention-deficit hyperactivity disorder, unspecified type: Secondary | ICD-10-CM | POA: Diagnosis not present

## 2021-10-21 DIAGNOSIS — F909 Attention-deficit hyperactivity disorder, unspecified type: Secondary | ICD-10-CM | POA: Diagnosis not present

## 2021-10-28 DIAGNOSIS — F909 Attention-deficit hyperactivity disorder, unspecified type: Secondary | ICD-10-CM | POA: Diagnosis not present

## 2021-10-29 DIAGNOSIS — F909 Attention-deficit hyperactivity disorder, unspecified type: Secondary | ICD-10-CM | POA: Diagnosis not present

## 2021-11-05 DIAGNOSIS — F909 Attention-deficit hyperactivity disorder, unspecified type: Secondary | ICD-10-CM | POA: Diagnosis not present

## 2021-11-07 DIAGNOSIS — F909 Attention-deficit hyperactivity disorder, unspecified type: Secondary | ICD-10-CM | POA: Diagnosis not present

## 2021-11-11 DIAGNOSIS — F909 Attention-deficit hyperactivity disorder, unspecified type: Secondary | ICD-10-CM | POA: Diagnosis not present

## 2021-11-13 DIAGNOSIS — F909 Attention-deficit hyperactivity disorder, unspecified type: Secondary | ICD-10-CM | POA: Diagnosis not present

## 2021-11-19 DIAGNOSIS — F909 Attention-deficit hyperactivity disorder, unspecified type: Secondary | ICD-10-CM | POA: Diagnosis not present

## 2021-11-20 DIAGNOSIS — F909 Attention-deficit hyperactivity disorder, unspecified type: Secondary | ICD-10-CM | POA: Diagnosis not present

## 2021-11-25 DIAGNOSIS — F909 Attention-deficit hyperactivity disorder, unspecified type: Secondary | ICD-10-CM | POA: Diagnosis not present

## 2021-11-27 DIAGNOSIS — F909 Attention-deficit hyperactivity disorder, unspecified type: Secondary | ICD-10-CM | POA: Diagnosis not present

## 2021-12-02 DIAGNOSIS — F909 Attention-deficit hyperactivity disorder, unspecified type: Secondary | ICD-10-CM | POA: Diagnosis not present

## 2021-12-03 DIAGNOSIS — F909 Attention-deficit hyperactivity disorder, unspecified type: Secondary | ICD-10-CM | POA: Diagnosis not present

## 2021-12-09 DIAGNOSIS — F909 Attention-deficit hyperactivity disorder, unspecified type: Secondary | ICD-10-CM | POA: Diagnosis not present

## 2021-12-10 DIAGNOSIS — F909 Attention-deficit hyperactivity disorder, unspecified type: Secondary | ICD-10-CM | POA: Diagnosis not present

## 2021-12-17 DIAGNOSIS — F909 Attention-deficit hyperactivity disorder, unspecified type: Secondary | ICD-10-CM | POA: Diagnosis not present

## 2021-12-19 DIAGNOSIS — F909 Attention-deficit hyperactivity disorder, unspecified type: Secondary | ICD-10-CM | POA: Diagnosis not present

## 2021-12-24 DIAGNOSIS — F909 Attention-deficit hyperactivity disorder, unspecified type: Secondary | ICD-10-CM | POA: Diagnosis not present

## 2021-12-26 DIAGNOSIS — F909 Attention-deficit hyperactivity disorder, unspecified type: Secondary | ICD-10-CM | POA: Diagnosis not present

## 2022-01-01 DIAGNOSIS — F909 Attention-deficit hyperactivity disorder, unspecified type: Secondary | ICD-10-CM | POA: Diagnosis not present

## 2022-01-03 DIAGNOSIS — F909 Attention-deficit hyperactivity disorder, unspecified type: Secondary | ICD-10-CM | POA: Diagnosis not present

## 2022-01-06 DIAGNOSIS — F909 Attention-deficit hyperactivity disorder, unspecified type: Secondary | ICD-10-CM | POA: Diagnosis not present

## 2022-01-08 DIAGNOSIS — F909 Attention-deficit hyperactivity disorder, unspecified type: Secondary | ICD-10-CM | POA: Diagnosis not present

## 2022-01-13 DIAGNOSIS — F909 Attention-deficit hyperactivity disorder, unspecified type: Secondary | ICD-10-CM | POA: Diagnosis not present

## 2022-01-16 DIAGNOSIS — F909 Attention-deficit hyperactivity disorder, unspecified type: Secondary | ICD-10-CM | POA: Diagnosis not present

## 2022-01-20 DIAGNOSIS — F909 Attention-deficit hyperactivity disorder, unspecified type: Secondary | ICD-10-CM | POA: Diagnosis not present

## 2022-01-24 DIAGNOSIS — F909 Attention-deficit hyperactivity disorder, unspecified type: Secondary | ICD-10-CM | POA: Diagnosis not present

## 2022-01-27 DIAGNOSIS — F909 Attention-deficit hyperactivity disorder, unspecified type: Secondary | ICD-10-CM | POA: Diagnosis not present

## 2022-01-28 DIAGNOSIS — F909 Attention-deficit hyperactivity disorder, unspecified type: Secondary | ICD-10-CM | POA: Diagnosis not present

## 2022-01-29 DIAGNOSIS — F411 Generalized anxiety disorder: Secondary | ICD-10-CM | POA: Diagnosis not present

## 2022-01-29 DIAGNOSIS — F902 Attention-deficit hyperactivity disorder, combined type: Secondary | ICD-10-CM | POA: Diagnosis not present

## 2022-02-03 DIAGNOSIS — F909 Attention-deficit hyperactivity disorder, unspecified type: Secondary | ICD-10-CM | POA: Diagnosis not present

## 2022-02-04 DIAGNOSIS — F909 Attention-deficit hyperactivity disorder, unspecified type: Secondary | ICD-10-CM | POA: Diagnosis not present

## 2022-02-05 DIAGNOSIS — F902 Attention-deficit hyperactivity disorder, combined type: Secondary | ICD-10-CM | POA: Diagnosis not present

## 2022-02-05 DIAGNOSIS — Z79899 Other long term (current) drug therapy: Secondary | ICD-10-CM | POA: Diagnosis not present

## 2022-02-05 DIAGNOSIS — F411 Generalized anxiety disorder: Secondary | ICD-10-CM | POA: Diagnosis not present

## 2022-02-11 DIAGNOSIS — F909 Attention-deficit hyperactivity disorder, unspecified type: Secondary | ICD-10-CM | POA: Diagnosis not present

## 2022-02-14 DIAGNOSIS — F909 Attention-deficit hyperactivity disorder, unspecified type: Secondary | ICD-10-CM | POA: Diagnosis not present

## 2022-02-17 DIAGNOSIS — F909 Attention-deficit hyperactivity disorder, unspecified type: Secondary | ICD-10-CM | POA: Diagnosis not present

## 2022-02-18 DIAGNOSIS — F909 Attention-deficit hyperactivity disorder, unspecified type: Secondary | ICD-10-CM | POA: Diagnosis not present

## 2022-02-24 DIAGNOSIS — F909 Attention-deficit hyperactivity disorder, unspecified type: Secondary | ICD-10-CM | POA: Diagnosis not present

## 2022-02-26 DIAGNOSIS — F909 Attention-deficit hyperactivity disorder, unspecified type: Secondary | ICD-10-CM | POA: Diagnosis not present

## 2022-03-09 DIAGNOSIS — F909 Attention-deficit hyperactivity disorder, unspecified type: Secondary | ICD-10-CM | POA: Diagnosis not present

## 2022-03-12 DIAGNOSIS — F909 Attention-deficit hyperactivity disorder, unspecified type: Secondary | ICD-10-CM | POA: Diagnosis not present

## 2022-04-17 DIAGNOSIS — F909 Attention-deficit hyperactivity disorder, unspecified type: Secondary | ICD-10-CM | POA: Diagnosis not present

## 2022-04-18 DIAGNOSIS — F909 Attention-deficit hyperactivity disorder, unspecified type: Secondary | ICD-10-CM | POA: Diagnosis not present

## 2022-04-22 DIAGNOSIS — F909 Attention-deficit hyperactivity disorder, unspecified type: Secondary | ICD-10-CM | POA: Diagnosis not present

## 2022-04-23 DIAGNOSIS — F909 Attention-deficit hyperactivity disorder, unspecified type: Secondary | ICD-10-CM | POA: Diagnosis not present

## 2022-04-30 DIAGNOSIS — F909 Attention-deficit hyperactivity disorder, unspecified type: Secondary | ICD-10-CM | POA: Diagnosis not present

## 2022-05-01 DIAGNOSIS — F909 Attention-deficit hyperactivity disorder, unspecified type: Secondary | ICD-10-CM | POA: Diagnosis not present

## 2022-05-05 DIAGNOSIS — F909 Attention-deficit hyperactivity disorder, unspecified type: Secondary | ICD-10-CM | POA: Diagnosis not present

## 2022-05-06 DIAGNOSIS — F909 Attention-deficit hyperactivity disorder, unspecified type: Secondary | ICD-10-CM | POA: Diagnosis not present

## 2022-05-11 DIAGNOSIS — F909 Attention-deficit hyperactivity disorder, unspecified type: Secondary | ICD-10-CM | POA: Diagnosis not present

## 2022-05-12 DIAGNOSIS — F909 Attention-deficit hyperactivity disorder, unspecified type: Secondary | ICD-10-CM | POA: Diagnosis not present

## 2022-05-18 DIAGNOSIS — F909 Attention-deficit hyperactivity disorder, unspecified type: Secondary | ICD-10-CM | POA: Diagnosis not present

## 2022-05-21 DIAGNOSIS — F909 Attention-deficit hyperactivity disorder, unspecified type: Secondary | ICD-10-CM | POA: Diagnosis not present

## 2022-05-26 DIAGNOSIS — F909 Attention-deficit hyperactivity disorder, unspecified type: Secondary | ICD-10-CM | POA: Diagnosis not present

## 2022-05-27 DIAGNOSIS — F909 Attention-deficit hyperactivity disorder, unspecified type: Secondary | ICD-10-CM | POA: Diagnosis not present

## 2022-06-02 DIAGNOSIS — F909 Attention-deficit hyperactivity disorder, unspecified type: Secondary | ICD-10-CM | POA: Diagnosis not present

## 2022-06-05 DIAGNOSIS — F909 Attention-deficit hyperactivity disorder, unspecified type: Secondary | ICD-10-CM | POA: Diagnosis not present

## 2022-06-08 DIAGNOSIS — F909 Attention-deficit hyperactivity disorder, unspecified type: Secondary | ICD-10-CM | POA: Diagnosis not present

## 2022-06-09 DIAGNOSIS — F909 Attention-deficit hyperactivity disorder, unspecified type: Secondary | ICD-10-CM | POA: Diagnosis not present

## 2022-08-08 DIAGNOSIS — F411 Generalized anxiety disorder: Secondary | ICD-10-CM | POA: Diagnosis not present

## 2022-08-08 DIAGNOSIS — F902 Attention-deficit hyperactivity disorder, combined type: Secondary | ICD-10-CM | POA: Diagnosis not present

## 2022-09-01 DIAGNOSIS — F419 Anxiety disorder, unspecified: Secondary | ICD-10-CM | POA: Diagnosis not present

## 2022-09-01 DIAGNOSIS — F909 Attention-deficit hyperactivity disorder, unspecified type: Secondary | ICD-10-CM | POA: Diagnosis not present

## 2022-09-24 DIAGNOSIS — F419 Anxiety disorder, unspecified: Secondary | ICD-10-CM | POA: Diagnosis not present

## 2022-09-24 DIAGNOSIS — F909 Attention-deficit hyperactivity disorder, unspecified type: Secondary | ICD-10-CM | POA: Diagnosis not present

## 2022-09-29 DIAGNOSIS — F419 Anxiety disorder, unspecified: Secondary | ICD-10-CM | POA: Diagnosis not present

## 2022-09-29 DIAGNOSIS — F909 Attention-deficit hyperactivity disorder, unspecified type: Secondary | ICD-10-CM | POA: Diagnosis not present

## 2022-09-30 DIAGNOSIS — F909 Attention-deficit hyperactivity disorder, unspecified type: Secondary | ICD-10-CM | POA: Diagnosis not present

## 2022-10-01 DIAGNOSIS — F909 Attention-deficit hyperactivity disorder, unspecified type: Secondary | ICD-10-CM | POA: Diagnosis not present

## 2022-10-02 DIAGNOSIS — F909 Attention-deficit hyperactivity disorder, unspecified type: Secondary | ICD-10-CM | POA: Diagnosis not present

## 2022-10-03 DIAGNOSIS — F419 Anxiety disorder, unspecified: Secondary | ICD-10-CM | POA: Diagnosis not present

## 2022-10-03 DIAGNOSIS — F909 Attention-deficit hyperactivity disorder, unspecified type: Secondary | ICD-10-CM | POA: Diagnosis not present

## 2022-10-06 DIAGNOSIS — F909 Attention-deficit hyperactivity disorder, unspecified type: Secondary | ICD-10-CM | POA: Diagnosis not present

## 2022-10-07 DIAGNOSIS — F909 Attention-deficit hyperactivity disorder, unspecified type: Secondary | ICD-10-CM | POA: Diagnosis not present

## 2022-10-07 DIAGNOSIS — F419 Anxiety disorder, unspecified: Secondary | ICD-10-CM | POA: Diagnosis not present

## 2022-10-08 DIAGNOSIS — F909 Attention-deficit hyperactivity disorder, unspecified type: Secondary | ICD-10-CM | POA: Diagnosis not present

## 2022-10-09 DIAGNOSIS — F909 Attention-deficit hyperactivity disorder, unspecified type: Secondary | ICD-10-CM | POA: Diagnosis not present

## 2022-10-10 DIAGNOSIS — F909 Attention-deficit hyperactivity disorder, unspecified type: Secondary | ICD-10-CM | POA: Diagnosis not present

## 2022-10-13 DIAGNOSIS — F909 Attention-deficit hyperactivity disorder, unspecified type: Secondary | ICD-10-CM | POA: Diagnosis not present

## 2022-10-14 DIAGNOSIS — F909 Attention-deficit hyperactivity disorder, unspecified type: Secondary | ICD-10-CM | POA: Diagnosis not present

## 2022-10-15 DIAGNOSIS — F909 Attention-deficit hyperactivity disorder, unspecified type: Secondary | ICD-10-CM | POA: Diagnosis not present

## 2022-10-16 DIAGNOSIS — F909 Attention-deficit hyperactivity disorder, unspecified type: Secondary | ICD-10-CM | POA: Diagnosis not present

## 2022-10-17 DIAGNOSIS — F909 Attention-deficit hyperactivity disorder, unspecified type: Secondary | ICD-10-CM | POA: Diagnosis not present

## 2022-10-20 DIAGNOSIS — F909 Attention-deficit hyperactivity disorder, unspecified type: Secondary | ICD-10-CM | POA: Diagnosis not present

## 2022-10-21 DIAGNOSIS — F909 Attention-deficit hyperactivity disorder, unspecified type: Secondary | ICD-10-CM | POA: Diagnosis not present

## 2022-10-22 DIAGNOSIS — F909 Attention-deficit hyperactivity disorder, unspecified type: Secondary | ICD-10-CM | POA: Diagnosis not present

## 2022-10-23 DIAGNOSIS — F909 Attention-deficit hyperactivity disorder, unspecified type: Secondary | ICD-10-CM | POA: Diagnosis not present

## 2022-10-24 DIAGNOSIS — F909 Attention-deficit hyperactivity disorder, unspecified type: Secondary | ICD-10-CM | POA: Diagnosis not present

## 2022-10-26 DIAGNOSIS — F909 Attention-deficit hyperactivity disorder, unspecified type: Secondary | ICD-10-CM | POA: Diagnosis not present

## 2022-10-27 DIAGNOSIS — F909 Attention-deficit hyperactivity disorder, unspecified type: Secondary | ICD-10-CM | POA: Diagnosis not present

## 2022-10-28 DIAGNOSIS — F909 Attention-deficit hyperactivity disorder, unspecified type: Secondary | ICD-10-CM | POA: Diagnosis not present

## 2022-10-31 DIAGNOSIS — F909 Attention-deficit hyperactivity disorder, unspecified type: Secondary | ICD-10-CM | POA: Diagnosis not present

## 2022-11-03 DIAGNOSIS — F909 Attention-deficit hyperactivity disorder, unspecified type: Secondary | ICD-10-CM | POA: Diagnosis not present

## 2022-11-04 DIAGNOSIS — F909 Attention-deficit hyperactivity disorder, unspecified type: Secondary | ICD-10-CM | POA: Diagnosis not present

## 2022-11-05 DIAGNOSIS — F909 Attention-deficit hyperactivity disorder, unspecified type: Secondary | ICD-10-CM | POA: Diagnosis not present

## 2022-11-06 DIAGNOSIS — F909 Attention-deficit hyperactivity disorder, unspecified type: Secondary | ICD-10-CM | POA: Diagnosis not present

## 2022-11-07 DIAGNOSIS — F909 Attention-deficit hyperactivity disorder, unspecified type: Secondary | ICD-10-CM | POA: Diagnosis not present

## 2022-11-09 DIAGNOSIS — F909 Attention-deficit hyperactivity disorder, unspecified type: Secondary | ICD-10-CM | POA: Diagnosis not present

## 2022-11-10 DIAGNOSIS — F909 Attention-deficit hyperactivity disorder, unspecified type: Secondary | ICD-10-CM | POA: Diagnosis not present

## 2022-11-11 DIAGNOSIS — F909 Attention-deficit hyperactivity disorder, unspecified type: Secondary | ICD-10-CM | POA: Diagnosis not present

## 2022-11-12 DIAGNOSIS — F909 Attention-deficit hyperactivity disorder, unspecified type: Secondary | ICD-10-CM | POA: Diagnosis not present

## 2022-11-17 DIAGNOSIS — F909 Attention-deficit hyperactivity disorder, unspecified type: Secondary | ICD-10-CM | POA: Diagnosis not present

## 2022-11-18 DIAGNOSIS — F909 Attention-deficit hyperactivity disorder, unspecified type: Secondary | ICD-10-CM | POA: Diagnosis not present

## 2022-11-19 DIAGNOSIS — F909 Attention-deficit hyperactivity disorder, unspecified type: Secondary | ICD-10-CM | POA: Diagnosis not present

## 2022-11-20 DIAGNOSIS — F909 Attention-deficit hyperactivity disorder, unspecified type: Secondary | ICD-10-CM | POA: Diagnosis not present

## 2022-11-24 DIAGNOSIS — F902 Attention-deficit hyperactivity disorder, combined type: Secondary | ICD-10-CM | POA: Diagnosis not present

## 2022-11-24 DIAGNOSIS — F909 Attention-deficit hyperactivity disorder, unspecified type: Secondary | ICD-10-CM | POA: Diagnosis not present

## 2022-11-24 DIAGNOSIS — F411 Generalized anxiety disorder: Secondary | ICD-10-CM | POA: Diagnosis not present

## 2022-11-25 DIAGNOSIS — F909 Attention-deficit hyperactivity disorder, unspecified type: Secondary | ICD-10-CM | POA: Diagnosis not present

## 2022-11-26 DIAGNOSIS — F909 Attention-deficit hyperactivity disorder, unspecified type: Secondary | ICD-10-CM | POA: Diagnosis not present

## 2022-11-27 DIAGNOSIS — F909 Attention-deficit hyperactivity disorder, unspecified type: Secondary | ICD-10-CM | POA: Diagnosis not present

## 2022-12-02 DIAGNOSIS — F909 Attention-deficit hyperactivity disorder, unspecified type: Secondary | ICD-10-CM | POA: Diagnosis not present

## 2022-12-03 DIAGNOSIS — F909 Attention-deficit hyperactivity disorder, unspecified type: Secondary | ICD-10-CM | POA: Diagnosis not present

## 2022-12-04 DIAGNOSIS — F909 Attention-deficit hyperactivity disorder, unspecified type: Secondary | ICD-10-CM | POA: Diagnosis not present

## 2022-12-29 DIAGNOSIS — F909 Attention-deficit hyperactivity disorder, unspecified type: Secondary | ICD-10-CM | POA: Diagnosis not present

## 2022-12-30 DIAGNOSIS — F909 Attention-deficit hyperactivity disorder, unspecified type: Secondary | ICD-10-CM | POA: Diagnosis not present

## 2022-12-31 DIAGNOSIS — F909 Attention-deficit hyperactivity disorder, unspecified type: Secondary | ICD-10-CM | POA: Diagnosis not present

## 2023-01-01 DIAGNOSIS — F909 Attention-deficit hyperactivity disorder, unspecified type: Secondary | ICD-10-CM | POA: Diagnosis not present

## 2023-01-05 DIAGNOSIS — F909 Attention-deficit hyperactivity disorder, unspecified type: Secondary | ICD-10-CM | POA: Diagnosis not present

## 2023-01-06 DIAGNOSIS — F909 Attention-deficit hyperactivity disorder, unspecified type: Secondary | ICD-10-CM | POA: Diagnosis not present

## 2023-01-07 DIAGNOSIS — F909 Attention-deficit hyperactivity disorder, unspecified type: Secondary | ICD-10-CM | POA: Diagnosis not present

## 2023-01-08 DIAGNOSIS — F909 Attention-deficit hyperactivity disorder, unspecified type: Secondary | ICD-10-CM | POA: Diagnosis not present

## 2023-01-12 DIAGNOSIS — F909 Attention-deficit hyperactivity disorder, unspecified type: Secondary | ICD-10-CM | POA: Diagnosis not present

## 2023-01-13 DIAGNOSIS — F909 Attention-deficit hyperactivity disorder, unspecified type: Secondary | ICD-10-CM | POA: Diagnosis not present

## 2023-01-14 DIAGNOSIS — F909 Attention-deficit hyperactivity disorder, unspecified type: Secondary | ICD-10-CM | POA: Diagnosis not present

## 2023-01-15 DIAGNOSIS — F909 Attention-deficit hyperactivity disorder, unspecified type: Secondary | ICD-10-CM | POA: Diagnosis not present

## 2023-01-19 DIAGNOSIS — F909 Attention-deficit hyperactivity disorder, unspecified type: Secondary | ICD-10-CM | POA: Diagnosis not present

## 2023-01-21 DIAGNOSIS — F909 Attention-deficit hyperactivity disorder, unspecified type: Secondary | ICD-10-CM | POA: Diagnosis not present

## 2023-01-22 DIAGNOSIS — F909 Attention-deficit hyperactivity disorder, unspecified type: Secondary | ICD-10-CM | POA: Diagnosis not present

## 2023-01-23 DIAGNOSIS — F909 Attention-deficit hyperactivity disorder, unspecified type: Secondary | ICD-10-CM | POA: Diagnosis not present

## 2023-01-25 DIAGNOSIS — F909 Attention-deficit hyperactivity disorder, unspecified type: Secondary | ICD-10-CM | POA: Diagnosis not present

## 2023-01-28 DIAGNOSIS — F909 Attention-deficit hyperactivity disorder, unspecified type: Secondary | ICD-10-CM | POA: Diagnosis not present

## 2023-01-29 DIAGNOSIS — F909 Attention-deficit hyperactivity disorder, unspecified type: Secondary | ICD-10-CM | POA: Diagnosis not present

## 2023-02-02 DIAGNOSIS — F909 Attention-deficit hyperactivity disorder, unspecified type: Secondary | ICD-10-CM | POA: Diagnosis not present

## 2023-02-03 DIAGNOSIS — F909 Attention-deficit hyperactivity disorder, unspecified type: Secondary | ICD-10-CM | POA: Diagnosis not present

## 2023-02-04 DIAGNOSIS — F909 Attention-deficit hyperactivity disorder, unspecified type: Secondary | ICD-10-CM | POA: Diagnosis not present

## 2023-02-05 DIAGNOSIS — F909 Attention-deficit hyperactivity disorder, unspecified type: Secondary | ICD-10-CM | POA: Diagnosis not present

## 2023-02-08 DIAGNOSIS — F909 Attention-deficit hyperactivity disorder, unspecified type: Secondary | ICD-10-CM | POA: Diagnosis not present

## 2023-02-09 DIAGNOSIS — F909 Attention-deficit hyperactivity disorder, unspecified type: Secondary | ICD-10-CM | POA: Diagnosis not present

## 2023-02-11 DIAGNOSIS — F909 Attention-deficit hyperactivity disorder, unspecified type: Secondary | ICD-10-CM | POA: Diagnosis not present

## 2023-02-12 DIAGNOSIS — F909 Attention-deficit hyperactivity disorder, unspecified type: Secondary | ICD-10-CM | POA: Diagnosis not present

## 2023-02-18 DIAGNOSIS — F909 Attention-deficit hyperactivity disorder, unspecified type: Secondary | ICD-10-CM | POA: Diagnosis not present

## 2023-02-19 DIAGNOSIS — F909 Attention-deficit hyperactivity disorder, unspecified type: Secondary | ICD-10-CM | POA: Diagnosis not present

## 2023-02-24 DIAGNOSIS — F909 Attention-deficit hyperactivity disorder, unspecified type: Secondary | ICD-10-CM | POA: Diagnosis not present

## 2023-02-26 DIAGNOSIS — F909 Attention-deficit hyperactivity disorder, unspecified type: Secondary | ICD-10-CM | POA: Diagnosis not present

## 2023-03-02 DIAGNOSIS — F909 Attention-deficit hyperactivity disorder, unspecified type: Secondary | ICD-10-CM | POA: Diagnosis not present

## 2023-03-03 DIAGNOSIS — F419 Anxiety disorder, unspecified: Secondary | ICD-10-CM | POA: Diagnosis not present

## 2023-03-03 DIAGNOSIS — F909 Attention-deficit hyperactivity disorder, unspecified type: Secondary | ICD-10-CM | POA: Diagnosis not present

## 2023-03-04 DIAGNOSIS — F909 Attention-deficit hyperactivity disorder, unspecified type: Secondary | ICD-10-CM | POA: Diagnosis not present

## 2023-03-05 DIAGNOSIS — F909 Attention-deficit hyperactivity disorder, unspecified type: Secondary | ICD-10-CM | POA: Diagnosis not present

## 2023-03-09 DIAGNOSIS — F909 Attention-deficit hyperactivity disorder, unspecified type: Secondary | ICD-10-CM | POA: Diagnosis not present

## 2023-03-10 DIAGNOSIS — F909 Attention-deficit hyperactivity disorder, unspecified type: Secondary | ICD-10-CM | POA: Diagnosis not present

## 2023-03-11 DIAGNOSIS — F909 Attention-deficit hyperactivity disorder, unspecified type: Secondary | ICD-10-CM | POA: Diagnosis not present

## 2023-03-12 DIAGNOSIS — F909 Attention-deficit hyperactivity disorder, unspecified type: Secondary | ICD-10-CM | POA: Diagnosis not present

## 2023-03-16 DIAGNOSIS — F909 Attention-deficit hyperactivity disorder, unspecified type: Secondary | ICD-10-CM | POA: Diagnosis not present

## 2023-03-17 DIAGNOSIS — F909 Attention-deficit hyperactivity disorder, unspecified type: Secondary | ICD-10-CM | POA: Diagnosis not present

## 2023-03-18 DIAGNOSIS — F909 Attention-deficit hyperactivity disorder, unspecified type: Secondary | ICD-10-CM | POA: Diagnosis not present

## 2023-03-18 DIAGNOSIS — F419 Anxiety disorder, unspecified: Secondary | ICD-10-CM | POA: Diagnosis not present

## 2023-03-19 DIAGNOSIS — F909 Attention-deficit hyperactivity disorder, unspecified type: Secondary | ICD-10-CM | POA: Diagnosis not present

## 2023-03-20 DIAGNOSIS — F909 Attention-deficit hyperactivity disorder, unspecified type: Secondary | ICD-10-CM | POA: Diagnosis not present

## 2023-03-23 DIAGNOSIS — F909 Attention-deficit hyperactivity disorder, unspecified type: Secondary | ICD-10-CM | POA: Diagnosis not present

## 2023-03-24 DIAGNOSIS — F909 Attention-deficit hyperactivity disorder, unspecified type: Secondary | ICD-10-CM | POA: Diagnosis not present

## 2023-03-25 DIAGNOSIS — F909 Attention-deficit hyperactivity disorder, unspecified type: Secondary | ICD-10-CM | POA: Diagnosis not present

## 2023-03-26 DIAGNOSIS — F909 Attention-deficit hyperactivity disorder, unspecified type: Secondary | ICD-10-CM | POA: Diagnosis not present

## 2023-04-07 DIAGNOSIS — F909 Attention-deficit hyperactivity disorder, unspecified type: Secondary | ICD-10-CM | POA: Diagnosis not present

## 2023-04-07 DIAGNOSIS — F419 Anxiety disorder, unspecified: Secondary | ICD-10-CM | POA: Diagnosis not present

## 2023-04-15 DIAGNOSIS — F909 Attention-deficit hyperactivity disorder, unspecified type: Secondary | ICD-10-CM | POA: Diagnosis not present

## 2023-04-15 DIAGNOSIS — F419 Anxiety disorder, unspecified: Secondary | ICD-10-CM | POA: Diagnosis not present

## 2023-04-27 DIAGNOSIS — F909 Attention-deficit hyperactivity disorder, unspecified type: Secondary | ICD-10-CM | POA: Diagnosis not present

## 2023-04-27 DIAGNOSIS — F419 Anxiety disorder, unspecified: Secondary | ICD-10-CM | POA: Diagnosis not present

## 2023-05-05 DIAGNOSIS — F909 Attention-deficit hyperactivity disorder, unspecified type: Secondary | ICD-10-CM | POA: Diagnosis not present

## 2023-05-05 DIAGNOSIS — F419 Anxiety disorder, unspecified: Secondary | ICD-10-CM | POA: Diagnosis not present

## 2023-05-14 DIAGNOSIS — F419 Anxiety disorder, unspecified: Secondary | ICD-10-CM | POA: Diagnosis not present

## 2023-05-14 DIAGNOSIS — F909 Attention-deficit hyperactivity disorder, unspecified type: Secondary | ICD-10-CM | POA: Diagnosis not present

## 2023-06-10 DIAGNOSIS — F909 Attention-deficit hyperactivity disorder, unspecified type: Secondary | ICD-10-CM | POA: Diagnosis not present

## 2023-06-11 DIAGNOSIS — F909 Attention-deficit hyperactivity disorder, unspecified type: Secondary | ICD-10-CM | POA: Diagnosis not present

## 2023-06-12 DIAGNOSIS — F909 Attention-deficit hyperactivity disorder, unspecified type: Secondary | ICD-10-CM | POA: Diagnosis not present

## 2023-06-16 DIAGNOSIS — F909 Attention-deficit hyperactivity disorder, unspecified type: Secondary | ICD-10-CM | POA: Diagnosis not present

## 2023-06-17 DIAGNOSIS — F909 Attention-deficit hyperactivity disorder, unspecified type: Secondary | ICD-10-CM | POA: Diagnosis not present

## 2023-06-18 DIAGNOSIS — F909 Attention-deficit hyperactivity disorder, unspecified type: Secondary | ICD-10-CM | POA: Diagnosis not present

## 2023-06-19 DIAGNOSIS — F909 Attention-deficit hyperactivity disorder, unspecified type: Secondary | ICD-10-CM | POA: Diagnosis not present

## 2023-06-23 DIAGNOSIS — F909 Attention-deficit hyperactivity disorder, unspecified type: Secondary | ICD-10-CM | POA: Diagnosis not present

## 2023-06-25 DIAGNOSIS — F909 Attention-deficit hyperactivity disorder, unspecified type: Secondary | ICD-10-CM | POA: Diagnosis not present

## 2023-06-26 DIAGNOSIS — F909 Attention-deficit hyperactivity disorder, unspecified type: Secondary | ICD-10-CM | POA: Diagnosis not present

## 2023-06-27 DIAGNOSIS — F909 Attention-deficit hyperactivity disorder, unspecified type: Secondary | ICD-10-CM | POA: Diagnosis not present

## 2023-06-29 DIAGNOSIS — F909 Attention-deficit hyperactivity disorder, unspecified type: Secondary | ICD-10-CM | POA: Diagnosis not present

## 2023-06-30 DIAGNOSIS — F909 Attention-deficit hyperactivity disorder, unspecified type: Secondary | ICD-10-CM | POA: Diagnosis not present

## 2023-07-01 DIAGNOSIS — F909 Attention-deficit hyperactivity disorder, unspecified type: Secondary | ICD-10-CM | POA: Diagnosis not present

## 2023-07-02 DIAGNOSIS — F909 Attention-deficit hyperactivity disorder, unspecified type: Secondary | ICD-10-CM | POA: Diagnosis not present

## 2023-08-10 DIAGNOSIS — F909 Attention-deficit hyperactivity disorder, unspecified type: Secondary | ICD-10-CM | POA: Diagnosis not present

## 2023-08-12 DIAGNOSIS — F909 Attention-deficit hyperactivity disorder, unspecified type: Secondary | ICD-10-CM | POA: Diagnosis not present

## 2023-08-13 DIAGNOSIS — F909 Attention-deficit hyperactivity disorder, unspecified type: Secondary | ICD-10-CM | POA: Diagnosis not present

## 2023-08-13 DIAGNOSIS — F419 Anxiety disorder, unspecified: Secondary | ICD-10-CM | POA: Diagnosis not present

## 2023-08-15 DIAGNOSIS — F909 Attention-deficit hyperactivity disorder, unspecified type: Secondary | ICD-10-CM | POA: Diagnosis not present

## 2023-08-18 DIAGNOSIS — F909 Attention-deficit hyperactivity disorder, unspecified type: Secondary | ICD-10-CM | POA: Diagnosis not present

## 2023-08-19 DIAGNOSIS — F909 Attention-deficit hyperactivity disorder, unspecified type: Secondary | ICD-10-CM | POA: Diagnosis not present

## 2023-08-20 DIAGNOSIS — F909 Attention-deficit hyperactivity disorder, unspecified type: Secondary | ICD-10-CM | POA: Diagnosis not present

## 2023-08-21 DIAGNOSIS — F909 Attention-deficit hyperactivity disorder, unspecified type: Secondary | ICD-10-CM | POA: Diagnosis not present

## 2023-08-22 DIAGNOSIS — F909 Attention-deficit hyperactivity disorder, unspecified type: Secondary | ICD-10-CM | POA: Diagnosis not present

## 2023-08-25 DIAGNOSIS — F909 Attention-deficit hyperactivity disorder, unspecified type: Secondary | ICD-10-CM | POA: Diagnosis not present

## 2023-08-26 DIAGNOSIS — F909 Attention-deficit hyperactivity disorder, unspecified type: Secondary | ICD-10-CM | POA: Diagnosis not present

## 2023-08-27 DIAGNOSIS — F909 Attention-deficit hyperactivity disorder, unspecified type: Secondary | ICD-10-CM | POA: Diagnosis not present

## 2023-08-28 DIAGNOSIS — F909 Attention-deficit hyperactivity disorder, unspecified type: Secondary | ICD-10-CM | POA: Diagnosis not present

## 2023-08-29 DIAGNOSIS — F909 Attention-deficit hyperactivity disorder, unspecified type: Secondary | ICD-10-CM | POA: Diagnosis not present

## 2023-09-02 DIAGNOSIS — F909 Attention-deficit hyperactivity disorder, unspecified type: Secondary | ICD-10-CM | POA: Diagnosis not present

## 2023-09-03 DIAGNOSIS — F909 Attention-deficit hyperactivity disorder, unspecified type: Secondary | ICD-10-CM | POA: Diagnosis not present

## 2023-09-04 DIAGNOSIS — F909 Attention-deficit hyperactivity disorder, unspecified type: Secondary | ICD-10-CM | POA: Diagnosis not present

## 2023-09-05 DIAGNOSIS — F909 Attention-deficit hyperactivity disorder, unspecified type: Secondary | ICD-10-CM | POA: Diagnosis not present

## 2023-09-06 DIAGNOSIS — F909 Attention-deficit hyperactivity disorder, unspecified type: Secondary | ICD-10-CM | POA: Diagnosis not present

## 2023-09-08 DIAGNOSIS — F909 Attention-deficit hyperactivity disorder, unspecified type: Secondary | ICD-10-CM | POA: Diagnosis not present

## 2023-09-10 DIAGNOSIS — F909 Attention-deficit hyperactivity disorder, unspecified type: Secondary | ICD-10-CM | POA: Diagnosis not present

## 2023-09-11 DIAGNOSIS — F909 Attention-deficit hyperactivity disorder, unspecified type: Secondary | ICD-10-CM | POA: Diagnosis not present

## 2023-09-12 DIAGNOSIS — F909 Attention-deficit hyperactivity disorder, unspecified type: Secondary | ICD-10-CM | POA: Diagnosis not present

## 2023-09-13 DIAGNOSIS — F909 Attention-deficit hyperactivity disorder, unspecified type: Secondary | ICD-10-CM | POA: Diagnosis not present

## 2023-09-16 DIAGNOSIS — F909 Attention-deficit hyperactivity disorder, unspecified type: Secondary | ICD-10-CM | POA: Diagnosis not present

## 2023-09-17 DIAGNOSIS — F909 Attention-deficit hyperactivity disorder, unspecified type: Secondary | ICD-10-CM | POA: Diagnosis not present

## 2023-09-19 DIAGNOSIS — F909 Attention-deficit hyperactivity disorder, unspecified type: Secondary | ICD-10-CM | POA: Diagnosis not present

## 2023-09-22 DIAGNOSIS — F909 Attention-deficit hyperactivity disorder, unspecified type: Secondary | ICD-10-CM | POA: Diagnosis not present

## 2023-09-23 DIAGNOSIS — F909 Attention-deficit hyperactivity disorder, unspecified type: Secondary | ICD-10-CM | POA: Diagnosis not present

## 2023-09-24 DIAGNOSIS — F909 Attention-deficit hyperactivity disorder, unspecified type: Secondary | ICD-10-CM | POA: Diagnosis not present

## 2023-09-25 DIAGNOSIS — F909 Attention-deficit hyperactivity disorder, unspecified type: Secondary | ICD-10-CM | POA: Diagnosis not present

## 2023-09-26 DIAGNOSIS — F909 Attention-deficit hyperactivity disorder, unspecified type: Secondary | ICD-10-CM | POA: Diagnosis not present

## 2023-09-27 DIAGNOSIS — F909 Attention-deficit hyperactivity disorder, unspecified type: Secondary | ICD-10-CM | POA: Diagnosis not present

## 2023-09-29 DIAGNOSIS — F419 Anxiety disorder, unspecified: Secondary | ICD-10-CM | POA: Diagnosis not present

## 2023-09-29 DIAGNOSIS — F909 Attention-deficit hyperactivity disorder, unspecified type: Secondary | ICD-10-CM | POA: Diagnosis not present

## 2023-09-30 DIAGNOSIS — F909 Attention-deficit hyperactivity disorder, unspecified type: Secondary | ICD-10-CM | POA: Diagnosis not present

## 2023-10-01 DIAGNOSIS — F909 Attention-deficit hyperactivity disorder, unspecified type: Secondary | ICD-10-CM | POA: Diagnosis not present

## 2023-10-02 DIAGNOSIS — F909 Attention-deficit hyperactivity disorder, unspecified type: Secondary | ICD-10-CM | POA: Diagnosis not present

## 2023-10-03 DIAGNOSIS — F909 Attention-deficit hyperactivity disorder, unspecified type: Secondary | ICD-10-CM | POA: Diagnosis not present

## 2023-10-04 DIAGNOSIS — F909 Attention-deficit hyperactivity disorder, unspecified type: Secondary | ICD-10-CM | POA: Diagnosis not present

## 2023-10-06 DIAGNOSIS — F909 Attention-deficit hyperactivity disorder, unspecified type: Secondary | ICD-10-CM | POA: Diagnosis not present

## 2023-10-07 DIAGNOSIS — F909 Attention-deficit hyperactivity disorder, unspecified type: Secondary | ICD-10-CM | POA: Diagnosis not present

## 2023-10-07 DIAGNOSIS — F419 Anxiety disorder, unspecified: Secondary | ICD-10-CM | POA: Diagnosis not present

## 2023-10-08 DIAGNOSIS — F909 Attention-deficit hyperactivity disorder, unspecified type: Secondary | ICD-10-CM | POA: Diagnosis not present

## 2023-10-10 DIAGNOSIS — F909 Attention-deficit hyperactivity disorder, unspecified type: Secondary | ICD-10-CM | POA: Diagnosis not present

## 2023-10-13 DIAGNOSIS — F419 Anxiety disorder, unspecified: Secondary | ICD-10-CM | POA: Diagnosis not present

## 2023-10-13 DIAGNOSIS — F909 Attention-deficit hyperactivity disorder, unspecified type: Secondary | ICD-10-CM | POA: Diagnosis not present

## 2023-10-15 DIAGNOSIS — F909 Attention-deficit hyperactivity disorder, unspecified type: Secondary | ICD-10-CM | POA: Diagnosis not present

## 2023-10-16 DIAGNOSIS — F909 Attention-deficit hyperactivity disorder, unspecified type: Secondary | ICD-10-CM | POA: Diagnosis not present

## 2023-10-17 DIAGNOSIS — F909 Attention-deficit hyperactivity disorder, unspecified type: Secondary | ICD-10-CM | POA: Diagnosis not present

## 2023-10-18 DIAGNOSIS — F909 Attention-deficit hyperactivity disorder, unspecified type: Secondary | ICD-10-CM | POA: Diagnosis not present

## 2023-10-20 DIAGNOSIS — F909 Attention-deficit hyperactivity disorder, unspecified type: Secondary | ICD-10-CM | POA: Diagnosis not present

## 2023-10-21 DIAGNOSIS — F909 Attention-deficit hyperactivity disorder, unspecified type: Secondary | ICD-10-CM | POA: Diagnosis not present

## 2023-10-22 DIAGNOSIS — F419 Anxiety disorder, unspecified: Secondary | ICD-10-CM | POA: Diagnosis not present

## 2023-10-22 DIAGNOSIS — F909 Attention-deficit hyperactivity disorder, unspecified type: Secondary | ICD-10-CM | POA: Diagnosis not present

## 2023-10-23 DIAGNOSIS — F909 Attention-deficit hyperactivity disorder, unspecified type: Secondary | ICD-10-CM | POA: Diagnosis not present

## 2023-10-24 DIAGNOSIS — F909 Attention-deficit hyperactivity disorder, unspecified type: Secondary | ICD-10-CM | POA: Diagnosis not present

## 2023-10-27 DIAGNOSIS — F419 Anxiety disorder, unspecified: Secondary | ICD-10-CM | POA: Diagnosis not present

## 2023-10-27 DIAGNOSIS — F909 Attention-deficit hyperactivity disorder, unspecified type: Secondary | ICD-10-CM | POA: Diagnosis not present

## 2023-10-28 DIAGNOSIS — F909 Attention-deficit hyperactivity disorder, unspecified type: Secondary | ICD-10-CM | POA: Diagnosis not present

## 2023-10-29 DIAGNOSIS — F909 Attention-deficit hyperactivity disorder, unspecified type: Secondary | ICD-10-CM | POA: Diagnosis not present

## 2023-10-30 DIAGNOSIS — F909 Attention-deficit hyperactivity disorder, unspecified type: Secondary | ICD-10-CM | POA: Diagnosis not present

## 2023-10-31 DIAGNOSIS — F909 Attention-deficit hyperactivity disorder, unspecified type: Secondary | ICD-10-CM | POA: Diagnosis not present

## 2023-11-03 DIAGNOSIS — F909 Attention-deficit hyperactivity disorder, unspecified type: Secondary | ICD-10-CM | POA: Diagnosis not present

## 2023-11-03 DIAGNOSIS — F419 Anxiety disorder, unspecified: Secondary | ICD-10-CM | POA: Diagnosis not present

## 2023-11-04 DIAGNOSIS — F909 Attention-deficit hyperactivity disorder, unspecified type: Secondary | ICD-10-CM | POA: Diagnosis not present

## 2023-11-06 DIAGNOSIS — F909 Attention-deficit hyperactivity disorder, unspecified type: Secondary | ICD-10-CM | POA: Diagnosis not present

## 2023-11-07 DIAGNOSIS — F909 Attention-deficit hyperactivity disorder, unspecified type: Secondary | ICD-10-CM | POA: Diagnosis not present

## 2023-11-08 DIAGNOSIS — F909 Attention-deficit hyperactivity disorder, unspecified type: Secondary | ICD-10-CM | POA: Diagnosis not present

## 2023-11-09 DIAGNOSIS — F909 Attention-deficit hyperactivity disorder, unspecified type: Secondary | ICD-10-CM | POA: Diagnosis not present

## 2023-11-11 DIAGNOSIS — F419 Anxiety disorder, unspecified: Secondary | ICD-10-CM | POA: Diagnosis not present

## 2023-11-11 DIAGNOSIS — F909 Attention-deficit hyperactivity disorder, unspecified type: Secondary | ICD-10-CM | POA: Diagnosis not present

## 2023-11-14 DIAGNOSIS — F909 Attention-deficit hyperactivity disorder, unspecified type: Secondary | ICD-10-CM | POA: Diagnosis not present

## 2023-11-18 DIAGNOSIS — F909 Attention-deficit hyperactivity disorder, unspecified type: Secondary | ICD-10-CM | POA: Diagnosis not present

## 2023-11-19 DIAGNOSIS — F909 Attention-deficit hyperactivity disorder, unspecified type: Secondary | ICD-10-CM | POA: Diagnosis not present

## 2023-11-20 DIAGNOSIS — F909 Attention-deficit hyperactivity disorder, unspecified type: Secondary | ICD-10-CM | POA: Diagnosis not present

## 2023-11-23 DIAGNOSIS — F909 Attention-deficit hyperactivity disorder, unspecified type: Secondary | ICD-10-CM | POA: Diagnosis not present

## 2023-11-24 DIAGNOSIS — F909 Attention-deficit hyperactivity disorder, unspecified type: Secondary | ICD-10-CM | POA: Diagnosis not present

## 2023-11-25 DIAGNOSIS — F909 Attention-deficit hyperactivity disorder, unspecified type: Secondary | ICD-10-CM | POA: Diagnosis not present

## 2023-11-26 DIAGNOSIS — F909 Attention-deficit hyperactivity disorder, unspecified type: Secondary | ICD-10-CM | POA: Diagnosis not present

## 2023-12-01 DIAGNOSIS — F909 Attention-deficit hyperactivity disorder, unspecified type: Secondary | ICD-10-CM | POA: Diagnosis not present

## 2023-12-03 DIAGNOSIS — F909 Attention-deficit hyperactivity disorder, unspecified type: Secondary | ICD-10-CM | POA: Diagnosis not present

## 2023-12-04 DIAGNOSIS — F909 Attention-deficit hyperactivity disorder, unspecified type: Secondary | ICD-10-CM | POA: Diagnosis not present

## 2023-12-05 DIAGNOSIS — F909 Attention-deficit hyperactivity disorder, unspecified type: Secondary | ICD-10-CM | POA: Diagnosis not present

## 2023-12-07 DIAGNOSIS — F909 Attention-deficit hyperactivity disorder, unspecified type: Secondary | ICD-10-CM | POA: Diagnosis not present

## 2023-12-08 DIAGNOSIS — F909 Attention-deficit hyperactivity disorder, unspecified type: Secondary | ICD-10-CM | POA: Diagnosis not present

## 2023-12-13 DIAGNOSIS — F909 Attention-deficit hyperactivity disorder, unspecified type: Secondary | ICD-10-CM | POA: Diagnosis not present

## 2023-12-15 DIAGNOSIS — F909 Attention-deficit hyperactivity disorder, unspecified type: Secondary | ICD-10-CM | POA: Diagnosis not present

## 2023-12-16 DIAGNOSIS — F909 Attention-deficit hyperactivity disorder, unspecified type: Secondary | ICD-10-CM | POA: Diagnosis not present

## 2023-12-17 DIAGNOSIS — F909 Attention-deficit hyperactivity disorder, unspecified type: Secondary | ICD-10-CM | POA: Diagnosis not present

## 2023-12-19 DIAGNOSIS — F909 Attention-deficit hyperactivity disorder, unspecified type: Secondary | ICD-10-CM | POA: Diagnosis not present

## 2023-12-29 DIAGNOSIS — F909 Attention-deficit hyperactivity disorder, unspecified type: Secondary | ICD-10-CM | POA: Diagnosis not present

## 2023-12-31 DIAGNOSIS — F419 Anxiety disorder, unspecified: Secondary | ICD-10-CM | POA: Diagnosis not present

## 2023-12-31 DIAGNOSIS — F909 Attention-deficit hyperactivity disorder, unspecified type: Secondary | ICD-10-CM | POA: Diagnosis not present

## 2024-01-01 DIAGNOSIS — F909 Attention-deficit hyperactivity disorder, unspecified type: Secondary | ICD-10-CM | POA: Diagnosis not present

## 2024-01-07 DIAGNOSIS — F419 Anxiety disorder, unspecified: Secondary | ICD-10-CM | POA: Diagnosis not present

## 2024-01-07 DIAGNOSIS — F909 Attention-deficit hyperactivity disorder, unspecified type: Secondary | ICD-10-CM | POA: Diagnosis not present

## 2024-01-09 DIAGNOSIS — F909 Attention-deficit hyperactivity disorder, unspecified type: Secondary | ICD-10-CM | POA: Diagnosis not present

## 2024-01-11 DIAGNOSIS — F909 Attention-deficit hyperactivity disorder, unspecified type: Secondary | ICD-10-CM | POA: Diagnosis not present

## 2024-01-12 DIAGNOSIS — F909 Attention-deficit hyperactivity disorder, unspecified type: Secondary | ICD-10-CM | POA: Diagnosis not present

## 2024-01-14 DIAGNOSIS — F909 Attention-deficit hyperactivity disorder, unspecified type: Secondary | ICD-10-CM | POA: Diagnosis not present

## 2024-01-16 DIAGNOSIS — F909 Attention-deficit hyperactivity disorder, unspecified type: Secondary | ICD-10-CM | POA: Diagnosis not present

## 2024-01-17 DIAGNOSIS — F909 Attention-deficit hyperactivity disorder, unspecified type: Secondary | ICD-10-CM | POA: Diagnosis not present

## 2024-01-23 DIAGNOSIS — F909 Attention-deficit hyperactivity disorder, unspecified type: Secondary | ICD-10-CM | POA: Diagnosis not present

## 2024-01-24 DIAGNOSIS — F909 Attention-deficit hyperactivity disorder, unspecified type: Secondary | ICD-10-CM | POA: Diagnosis not present

## 2024-01-25 DIAGNOSIS — F909 Attention-deficit hyperactivity disorder, unspecified type: Secondary | ICD-10-CM | POA: Diagnosis not present

## 2024-01-28 DIAGNOSIS — F909 Attention-deficit hyperactivity disorder, unspecified type: Secondary | ICD-10-CM | POA: Diagnosis not present

## 2024-01-29 DIAGNOSIS — F909 Attention-deficit hyperactivity disorder, unspecified type: Secondary | ICD-10-CM | POA: Diagnosis not present

## 2024-01-31 DIAGNOSIS — F909 Attention-deficit hyperactivity disorder, unspecified type: Secondary | ICD-10-CM | POA: Diagnosis not present

## 2024-02-03 DIAGNOSIS — F909 Attention-deficit hyperactivity disorder, unspecified type: Secondary | ICD-10-CM | POA: Diagnosis not present

## 2024-02-03 DIAGNOSIS — F419 Anxiety disorder, unspecified: Secondary | ICD-10-CM | POA: Diagnosis not present

## 2024-02-07 DIAGNOSIS — F909 Attention-deficit hyperactivity disorder, unspecified type: Secondary | ICD-10-CM | POA: Diagnosis not present

## 2024-02-10 DIAGNOSIS — F909 Attention-deficit hyperactivity disorder, unspecified type: Secondary | ICD-10-CM | POA: Diagnosis not present

## 2024-02-11 DIAGNOSIS — F909 Attention-deficit hyperactivity disorder, unspecified type: Secondary | ICD-10-CM | POA: Diagnosis not present

## 2024-02-12 DIAGNOSIS — F909 Attention-deficit hyperactivity disorder, unspecified type: Secondary | ICD-10-CM | POA: Diagnosis not present

## 2024-02-14 DIAGNOSIS — F909 Attention-deficit hyperactivity disorder, unspecified type: Secondary | ICD-10-CM | POA: Diagnosis not present

## 2024-02-19 DIAGNOSIS — F909 Attention-deficit hyperactivity disorder, unspecified type: Secondary | ICD-10-CM | POA: Diagnosis not present

## 2024-02-20 DIAGNOSIS — F909 Attention-deficit hyperactivity disorder, unspecified type: Secondary | ICD-10-CM | POA: Diagnosis not present

## 2024-02-24 DIAGNOSIS — F419 Anxiety disorder, unspecified: Secondary | ICD-10-CM | POA: Diagnosis not present

## 2024-02-24 DIAGNOSIS — F909 Attention-deficit hyperactivity disorder, unspecified type: Secondary | ICD-10-CM | POA: Diagnosis not present

## 2024-02-25 DIAGNOSIS — F909 Attention-deficit hyperactivity disorder, unspecified type: Secondary | ICD-10-CM | POA: Diagnosis not present

## 2024-02-26 DIAGNOSIS — F909 Attention-deficit hyperactivity disorder, unspecified type: Secondary | ICD-10-CM | POA: Diagnosis not present

## 2024-02-27 DIAGNOSIS — F909 Attention-deficit hyperactivity disorder, unspecified type: Secondary | ICD-10-CM | POA: Diagnosis not present

## 2024-03-09 DIAGNOSIS — F909 Attention-deficit hyperactivity disorder, unspecified type: Secondary | ICD-10-CM | POA: Diagnosis not present

## 2024-03-09 DIAGNOSIS — F419 Anxiety disorder, unspecified: Secondary | ICD-10-CM | POA: Diagnosis not present

## 2024-03-14 DIAGNOSIS — F419 Anxiety disorder, unspecified: Secondary | ICD-10-CM | POA: Diagnosis not present

## 2024-03-14 DIAGNOSIS — F909 Attention-deficit hyperactivity disorder, unspecified type: Secondary | ICD-10-CM | POA: Diagnosis not present

## 2024-03-24 DIAGNOSIS — F909 Attention-deficit hyperactivity disorder, unspecified type: Secondary | ICD-10-CM | POA: Diagnosis not present

## 2024-03-24 DIAGNOSIS — F419 Anxiety disorder, unspecified: Secondary | ICD-10-CM | POA: Diagnosis not present

## 2024-06-18 DIAGNOSIS — Z419 Encounter for procedure for purposes other than remedying health state, unspecified: Secondary | ICD-10-CM | POA: Diagnosis not present

## 2024-06-21 DIAGNOSIS — F411 Generalized anxiety disorder: Secondary | ICD-10-CM | POA: Diagnosis not present

## 2024-06-28 DIAGNOSIS — F411 Generalized anxiety disorder: Secondary | ICD-10-CM | POA: Diagnosis not present

## 2024-07-05 DIAGNOSIS — F411 Generalized anxiety disorder: Secondary | ICD-10-CM | POA: Diagnosis not present

## 2024-07-12 DIAGNOSIS — F411 Generalized anxiety disorder: Secondary | ICD-10-CM | POA: Diagnosis not present

## 2024-07-19 DIAGNOSIS — Z419 Encounter for procedure for purposes other than remedying health state, unspecified: Secondary | ICD-10-CM | POA: Diagnosis not present

## 2024-07-19 DIAGNOSIS — F411 Generalized anxiety disorder: Secondary | ICD-10-CM | POA: Diagnosis not present

## 2024-07-26 DIAGNOSIS — F411 Generalized anxiety disorder: Secondary | ICD-10-CM | POA: Diagnosis not present

## 2024-08-02 DIAGNOSIS — F411 Generalized anxiety disorder: Secondary | ICD-10-CM | POA: Diagnosis not present

## 2024-08-09 DIAGNOSIS — F411 Generalized anxiety disorder: Secondary | ICD-10-CM | POA: Diagnosis not present

## 2024-08-16 DIAGNOSIS — F411 Generalized anxiety disorder: Secondary | ICD-10-CM | POA: Diagnosis not present

## 2024-08-19 DIAGNOSIS — Z419 Encounter for procedure for purposes other than remedying health state, unspecified: Secondary | ICD-10-CM | POA: Diagnosis not present

## 2024-08-23 DIAGNOSIS — F411 Generalized anxiety disorder: Secondary | ICD-10-CM | POA: Diagnosis not present

## 2024-08-30 DIAGNOSIS — F411 Generalized anxiety disorder: Secondary | ICD-10-CM | POA: Diagnosis not present

## 2024-09-03 DIAGNOSIS — F411 Generalized anxiety disorder: Secondary | ICD-10-CM | POA: Diagnosis not present

## 2024-09-06 DIAGNOSIS — F411 Generalized anxiety disorder: Secondary | ICD-10-CM | POA: Diagnosis not present
